# Patient Record
Sex: Male | Born: 1985 | Race: White | Hispanic: No | Marital: Married | Smoking: Former smoker
Health system: Southern US, Community
[De-identification: ages and names within clinical notes are randomized; demographics above are authoritative.]

## PROBLEM LIST (undated history)

## (undated) DIAGNOSIS — N200 Calculus of kidney: Secondary | ICD-10-CM

---

## 2006-04-30 ENCOUNTER — Emergency Department: Payer: Self-pay | Admitting: Emergency Medicine

## 2006-07-11 ENCOUNTER — Emergency Department: Payer: Self-pay | Admitting: Emergency Medicine

## 2007-01-14 ENCOUNTER — Emergency Department: Payer: Self-pay | Admitting: Unknown Physician Specialty

## 2007-12-06 ENCOUNTER — Emergency Department: Payer: Self-pay | Admitting: Emergency Medicine

## 2010-01-08 ENCOUNTER — Emergency Department: Payer: Self-pay | Admitting: Internal Medicine

## 2014-02-10 ENCOUNTER — Emergency Department: Payer: Self-pay | Admitting: Emergency Medicine

## 2014-02-11 LAB — URINALYSIS, COMPLETE
Bacteria: NONE SEEN
Bilirubin,UR: NEGATIVE
GLUCOSE, UR: NEGATIVE mg/dL (ref 0–75)
KETONE: NEGATIVE
Leukocyte Esterase: NEGATIVE
Nitrite: NEGATIVE
PH: 5 (ref 4.5–8.0)
Protein: NEGATIVE
Specific Gravity: 1.027 (ref 1.003–1.030)
Squamous Epithelial: <1
WBC UR: 3 /HPF (ref 0–5)

## 2014-02-11 LAB — CBC
HCT: 45.3 % (ref 40.0–52.0)
HGB: 15.2 g/dL (ref 13.0–18.0)
MCH: 27.9 pg (ref 26.0–34.0)
MCHC: 33.6 g/dL (ref 32.0–36.0)
MCV: 83 fL (ref 80–100)
Platelet: 218 10*3/uL (ref 150–440)
RBC: 5.47 10*6/uL (ref 4.40–5.90)
RDW: 13.2 % (ref 11.5–14.5)
WBC: 13.8 10*3/uL — ABNORMAL HIGH (ref 3.8–10.6)

## 2014-02-11 LAB — COMPREHENSIVE METABOLIC PANEL
ALBUMIN: 3.8 g/dL (ref 3.4–5.0)
ALT: 63 U/L (ref 12–78)
ANION GAP: 8 (ref 7–16)
Alkaline Phosphatase: 78 U/L
BUN: 14 mg/dL (ref 7–18)
Bilirubin,Total: 0.3 mg/dL (ref 0.2–1.0)
CO2: 25 mmol/L (ref 21–32)
Calcium, Total: 9 mg/dL (ref 8.5–10.1)
Chloride: 108 mmol/L — ABNORMAL HIGH (ref 98–107)
Creatinine: 1.07 mg/dL (ref 0.60–1.30)
EGFR (Non-African Amer.): >60
Glucose: 150 mg/dL — ABNORMAL HIGH (ref 65–99)
Osmolality: 285 (ref 275–301)
POTASSIUM: 3.3 mmol/L — AB (ref 3.5–5.1)
SGOT(AST): 39 U/L — ABNORMAL HIGH (ref 15–37)
Sodium: 141 mmol/L (ref 136–145)
TOTAL PROTEIN: 7.4 g/dL (ref 6.4–8.2)

## 2015-04-17 ENCOUNTER — Emergency Department
Admission: EM | Admit: 2015-04-17 | Discharge: 2015-04-17 | Disposition: A | Discharge status: Home / Self Care | Payer: PRIVATE HEALTH INSURANCE | Attending: Emergency Medicine | Admitting: Emergency Medicine

## 2015-04-17 ENCOUNTER — Emergency Department: Payer: PRIVATE HEALTH INSURANCE

## 2015-04-17 ENCOUNTER — Encounter: Payer: Self-pay | Admitting: Emergency Medicine

## 2015-04-17 DIAGNOSIS — J069 Acute upper respiratory infection, unspecified: Secondary | ICD-10-CM | POA: Diagnosis not present

## 2015-04-17 DIAGNOSIS — Z87891 Personal history of nicotine dependence: Secondary | ICD-10-CM | POA: Diagnosis not present

## 2015-04-17 DIAGNOSIS — J9801 Acute bronchospasm: Secondary | ICD-10-CM

## 2015-04-17 DIAGNOSIS — R509 Fever, unspecified: Secondary | ICD-10-CM | POA: Diagnosis present

## 2015-04-17 DIAGNOSIS — Z7951 Long term (current) use of inhaled steroids: Secondary | ICD-10-CM | POA: Diagnosis not present

## 2015-04-17 DIAGNOSIS — J219 Acute bronchiolitis, unspecified: Secondary | ICD-10-CM | POA: Diagnosis not present

## 2015-04-17 MED ORDER — FLUTICASONE PROPIONATE 50 MCG/ACT NA SUSP: 1.0000 | Freq: Every day | NASAL | Status: DC

## 2015-04-17 MED ORDER — BENZONATATE 100 MG PO CAPS: 100.0000 mg | ORAL_CAPSULE | Freq: Three times a day (TID) | ORAL | Status: DC | PRN

## 2015-04-17 MED ORDER — ALBUTEROL SULFATE HFA 108 (90 BASE) MCG/ACT IN AERS: 2.0000 | INHALATION_SPRAY | Freq: Four times a day (QID) | RESPIRATORY_TRACT | Status: AC | PRN

## 2015-04-17 MED ORDER — IPRATROPIUM-ALBUTEROL 0.5-2.5 (3) MG/3ML IN SOLN
RESPIRATORY_TRACT | Status: AC
  Filled 2015-04-17: qty 3

## 2015-04-17 MED ORDER — IPRATROPIUM-ALBUTEROL 0.5-2.5 (3) MG/3ML IN SOLN
3.0000 mL | Freq: Once | RESPIRATORY_TRACT | Status: AC
  Administered 2015-04-17: 3 mL via RESPIRATORY_TRACT

## 2015-04-17 NOTE — Discharge Instructions (Signed)
Bronchospasm °A bronchospasm is a spasm or tightening of the airways going into the lungs. During a bronchospasm breathing becomes more difficult because the airways get smaller. When this happens there can be coughing, a whistling sound when breathing (wheezing), and difficulty breathing. Bronchospasm is often associated with asthma, but not all patients who experience a bronchospasm have asthma. °CAUSES  °A bronchospasm is caused by inflammation or irritation of the airways. The inflammation or irritation may be triggered by:  °· Allergies (such as to animals, pollen, food, or mold). Allergens that cause bronchospasm may cause wheezing immediately after exposure or many hours later.   °· Infection. Viral infections are believed to be the most common cause of bronchospasm.   °· Exercise.   °· Irritants (such as pollution, cigarette smoke, strong odors, aerosol sprays, and paint fumes).   °· Weather changes. Winds increase molds and pollens in the air. Rain refreshes the air by washing irritants out. Cold air may cause inflammation.   °· Stress and emotional upset.   °SIGNS AND SYMPTOMS  °· Wheezing.   °· Excessive nighttime coughing.   °· Frequent or severe coughing with a simple cold.   °· Chest tightness.   °· Shortness of breath.   °DIAGNOSIS  °Bronchospasm is usually diagnosed through a history and physical exam. Tests, such as chest X-rays, are sometimes done to look for other conditions. °TREATMENT  °· Inhaled medicines can be given to open up your airways and help you breathe. The medicines can be given using either an inhaler or a nebulizer machine. °· Corticosteroid medicines may be given for severe bronchospasm, usually when it is associated with asthma. °HOME CARE INSTRUCTIONS  °· Always have a plan prepared for seeking medical care. Know when to call your health care provider and local emergency services (911 in the [place] Know where you can access local emergency care. °· Only take medicines as  directed by your health care provider. °· If you were prescribed an inhaler or nebulizer machine, ask your health care provider to explain how to use it correctly. Always use a spacer with your inhaler if you were given one. °· It is necessary to remain calm during an attack. Try to relax and breathe more slowly.  °· Control your home environment in the following ways:   °¨ Change your heating and air conditioning filter at least once a month.   °¨ Limit your use of fireplaces and wood stoves. °¨ Do not smoke and do not allow smoking in your home.   °¨ Avoid exposure to perfumes and fragrances.   °¨ Get rid of pests (such as roaches and mice) and their droppings.   °¨ Throw away plants if you see mold on them.   °¨ Keep your house clean and dust free.   °¨ Replace carpet with wood, tile, or vinyl flooring. Carpet can trap dander and dust.   °¨ Use allergy-proof pillows, mattress covers, and box spring covers.   °¨ Wash bed sheets and blankets every week in hot water and dry them in a dryer.   °¨ Use blankets that are made of polyester or cotton.   °¨ Wash hands frequently. °SEEK MEDICAL CARE IF:  °· You have muscle aches.   °· You have chest pain.   °· The sputum changes from clear or white to yellow, green, gray, or bloody.   °· The sputum you cough up gets thicker.   °· There are problems that may be related to the medicine you are given, such as a rash, itching, swelling, or trouble breathing.   °SEEK IMMEDIATE MEDICAL CARE IF:  °· You have worsening wheezing and coughing even   after taking your prescribed medicines.   You have increased difficulty breathing.   You develop severe chest pain. MAKE SURE YOU:   Understand these instructions.  Will watch your condition.  Will get help right away if you are not doing well or get worse. Document Released: 11/23/2003 Document Revised: 11/25/2013 Document Reviewed: 05/12/2013 [facility] Patient Information 2015 [place], [place]. This information is not  intended to replace advice given to you by your health care provider. Make sure you discuss any questions you have with your health care provider.  Upper Respiratory Infection, Adult An upper respiratory infection (URI) is also known as the common cold. It is often caused by a type of germ (virus). Colds are easily spread (contagious). You can pass it to others by kissing, coughing, sneezing, or drinking out of the same glass. Usually, you get better in 1 or 2 weeks.  HOME CARE   Only take medicine as told by your doctor.  Use a warm mist humidifier or breathe in steam from a hot shower.  Drink enough water and fluids to keep your pee (urine) clear or pale yellow.  Get plenty of rest.  Return to work when your temperature is back to normal or as told by your doctor. You may use a face mask and wash your hands to stop your cold from spreading. GET HELP RIGHT AWAY IF:   After the first few days, you feel you are getting worse.  You have questions about your medicine.  You have chills, shortness of breath, or brown or red spit (mucus).  You have yellow or brown snot (nasal discharge) or pain in the face, especially when you bend forward.  You have a fever, puffy (swollen) neck, pain when you swallow, or white spots in the back of your throat.  You have a bad headache, ear pain, sinus pain, or chest pain.  You have a high-pitched whistling sound when you breathe in and out (wheezing).  You have a lasting cough or cough up blood.  You have sore muscles or a stiff neck. MAKE SURE YOU:   Understand these instructions.  Will watch your condition.  Will get help right away if you are not doing well or get worse. Document Released: 05/08/2008 Document Revised: 02/12/2012 Document Reviewed: 02/25/2014 [facility] Patient Information 2015 [place], [place]. This information is not intended to replace advice given to you by your health care provider. Make sure you discuss any questions you  have with your health care provider.  Your exam and chest x-ray are essentially normal.  You have a cough that may be allergy-induced.  Take the prescription meds as directed.  [facility] your provider [facility]. Consider starting a daily OTC allergy medicine.

## 2015-04-17 NOTE — ED Provider Notes (Signed)
[facility] Emergency Department Provider Note? ____________________________________________ ? Time seen: 10:42 AM on 04/17/2015 ----------------------------------------- ? I have reviewed the triage vital signs and the nursing notes. ________ HISTORY ? Chief Complaint Cough and Fever  HPI  Darrell Barnes is a 29 y.o. male with nonproductive cough 3 days. He also notes some central chest wall pain and some intermittent chills. He denies any other URI or sinus symptoms, has been dosing daytime cold medicines with minimal relief. He is unclear about whether he has had any low-grade fevers, but does report some intermittent chills as well.  Review of Systems  Constitutional: Negative for fever. Positive for chills Eyes: Negative for visual changes. ENT: Negative for sore throat. Cardiovascular: Negative for chest pain. Respiratory: Negative for shortness of breath. Positive for cough. Gastrointestinal: Negative for abdominal pain, vomiting and diarrhea. Musculoskeletal: Negative for back pain. Skin: Negative for rash. Neurological: Negative for headaches, focal weakness or numbness.  10-point ROS otherwise negative. ____________________________________________  History reviewed. No pertinent past medical history.  There are no active problems to display for this patient. ? History reviewed. No pertinent past surgical history. ? Current Outpatient Rx  Name  Route  Sig  Dispense  Refill  . albuterol (PROVENTIL HFA;VENTOLIN HFA) 108 (90 BASE) MCG/ACT inhaler   Inhalation   Inhale 2 puffs into the lungs every 6 (six) hours as needed for wheezing or shortness of breath.   1 Inhaler   0   . benzonatate (TESSALON PERLES) 100 MG capsule   Oral   Take 1 capsule (100 mg total) by mouth 3 (three) times daily as needed for cough (Take 1-2 per dose).   30 capsule   0   . fluticasone (FLONASE) 50 MCG/ACT nasal spray   Each Nare   Place 1 spray into both  nostrils daily.   16 g   0   ? Allergies Review of patient's allergies indicates no known allergies. ? History reviewed. No pertinent family history. ? Social History History  Substance Use Topics  . Smoking status: Former Games developermoker  . Smokeless tobacco: Never Used  . Alcohol Use: No   PHYSICAL EXAM:  VITAL SIGNS: ED Triage Vitals  Enc Vitals Group     BP 04/17/15 0938 132/57 mmHg     Pulse Rate 04/17/15 0938 83     Resp 04/17/15 0938 20     Temp 04/17/15 0938 99.8 F (37.7 C)     Temp Source 04/17/15 0938 Oral     SpO2 04/17/15 0938 98 %     Weight 04/17/15 0938 260 lb (117.935 kg)     Height 04/17/15 0938 5\' 5"  (1.651 m)     Head Cir --      Peak Flow --      Pain Score 04/17/15 0938 3     Pain Loc --      Pain Edu? --      Excl. in GC? --    Constitutional: Alert and oriented. Well appearing and in no distress. Eyes: Conjunctivae are normal. PERRL. Normal extraocular movements. ENT   Head: Normocephalic and atraumatic.   Nose: No congestion/rhinnorhea.   Mouth/Throat: Mucous membranes are moist.      Ears: Normal external exam. Canals clear. TMs clear bilaterally.   Neck: Supple. No lymphadenopathy. Cardiovascular: Normal rate, regular rhythm. Normal and symmetric distal pulses are present in all extremities. No murmurs, rubs, or gallops. Respiratory: Normal respiratory effort without tachypnea nor retractions. Breath sounds are clear and equal bilaterally.  No wheezes/rales/rhonchi. Gastrointestinal: Soft and nontender.  Musculoskeletal: Normal range of motion in all extremities.  Neurologic:  Normal speech and language. No gross focal neurologic deficits are appreciated.  Skin:  Skin is warm, dry and intact. No rash noted. Psychiatric: Mood and affect are normal. Patient exhibits appropriate insight and judgment.  ___________ RADIOLOGY  CXR IMPRESSION: No acute cardiopulmonary  disease. ______________________________________________________ INITIAL IMPRESSION / ASSESSMENT AND PLAN / ED COURSE ? Bronchitis/URI. Radiology results to patient.  Take meds as directed.  Follow-up with your provider as needed.  Pertinent labs & imaging results that were available during my care of the patient were reviewed by me and considered in my medical decision making (see chart for details). _________________________________________ FINAL CLINICAL IMPRESSION(S) / ED DIAGNOSES?  Final diagnoses:  Bronchospasm, acute  URI, acute      Lissa HoardJenise V Bacon Malvern Kadlec, PA-C 04/17/15 1224  Minna AntisKevin Paduchowski, MD 04/18/15 1209

## 2015-04-17 NOTE — ED Notes (Signed)
Patient reports cough x3 days with mild fever. Unable to give history of exact fever measurements. Patient denies taking any medication today. Reports intermittently coughing up green sputum.

## 2017-08-09 ENCOUNTER — Encounter: Payer: Self-pay | Admitting: Emergency Medicine

## 2017-08-09 ENCOUNTER — Emergency Department
Admission: EM | Admit: 2017-08-09 | Discharge: 2017-08-09 | Disposition: A | Discharge status: Home / Self Care | Payer: PRIVATE HEALTH INSURANCE | Attending: Emergency Medicine | Admitting: Emergency Medicine

## 2017-08-09 ENCOUNTER — Emergency Department: Payer: PRIVATE HEALTH INSURANCE

## 2017-08-09 DIAGNOSIS — Z87891 Personal history of nicotine dependence: Secondary | ICD-10-CM | POA: Diagnosis not present

## 2017-08-09 DIAGNOSIS — K8020 Calculus of gallbladder without cholecystitis without obstruction: Secondary | ICD-10-CM

## 2017-08-09 DIAGNOSIS — N202 Calculus of kidney with calculus of ureter: Secondary | ICD-10-CM | POA: Insufficient documentation

## 2017-08-09 DIAGNOSIS — R109 Unspecified abdominal pain: Secondary | ICD-10-CM | POA: Diagnosis present

## 2017-08-09 DIAGNOSIS — N200 Calculus of kidney: Secondary | ICD-10-CM

## 2017-08-09 HISTORY — DX: Calculus of kidney: N20.0

## 2017-08-09 LAB — URINALYSIS, COMPLETE (UACMP) WITH MICROSCOPIC
Bacteria, UA: NONE SEEN
Bilirubin Urine: NEGATIVE
GLUCOSE, UA: NEGATIVE mg/dL
Ketones, ur: NEGATIVE mg/dL
Leukocytes, UA: NEGATIVE
Nitrite: NEGATIVE
Protein, ur: NEGATIVE mg/dL
Specific Gravity, Urine: 1.02 (ref 1.005–1.030)
pH: 5 (ref 5.0–8.0)

## 2017-08-09 MED ORDER — ONDANSETRON HCL 4 MG/2ML IJ SOLN
INTRAMUSCULAR | Status: AC
  Administered 2017-08-09: 4 mg via INTRAVENOUS
  Filled 2017-08-09: qty 2

## 2017-08-09 MED ORDER — ONDANSETRON HCL 4 MG/2ML IJ SOLN
4.0000 mg | Freq: Once | INTRAMUSCULAR | Status: AC
  Administered 2017-08-09: 4 mg via INTRAVENOUS

## 2017-08-09 MED ORDER — ONDANSETRON 4 MG PO TBDP: 4.0000 mg | ORAL_TABLET | Freq: Three times a day (TID) | ORAL | 0 refills | Status: DC | PRN

## 2017-08-09 MED ORDER — OXYCODONE-ACETAMINOPHEN 5-325 MG PO TABS: 1.0000 | ORAL_TABLET | ORAL | 0 refills | Status: DC | PRN

## 2017-08-09 MED ORDER — MORPHINE SULFATE (PF) 4 MG/ML IV SOLN
INTRAVENOUS | Status: AC
  Administered 2017-08-09: 4 mg via INTRAVENOUS
  Filled 2017-08-09: qty 1

## 2017-08-09 MED ORDER — MORPHINE SULFATE (PF) 4 MG/ML IV SOLN
4.0000 mg | Freq: Once | INTRAVENOUS | Status: AC
  Administered 2017-08-09: 4 mg via INTRAVENOUS

## 2017-08-09 NOTE — ED Provider Notes (Signed)
[facility] Emergency Department Provider Note   First MD Initiated Contact with Patient 08/09/17 [phone]     (approximate)  I have reviewed the triage vital signs and the nursing notes.   HISTORY  Chief Complaint Flank Pain    HPI Darrell Barnes is a 31 y.o. male presents 1 day history of left flank pain and is currently 7 out of 10. Patient states at maximum intensity painis 10 out of 10. Patient admits to one episode of vomiting upon awakening this morning.   Past Medical History:  Diagnosis Date  . Kidney stone     There are no active problems to display for this patient.  Past Surgical History None  Prior to Admission medications   Medication Sig Start Date End Date Taking? Authorizing Provider  albuterol (PROVENTIL HFA;VENTOLIN HFA) 108 (90 BASE) MCG/ACT inhaler Inhale 2 puffs into the lungs every 6 (six) hours as needed for wheezing or shortness of breath. 04/17/15   Menshew, Charlesetta Ivory, PA-C  benzonatate (TESSALON PERLES) 100 MG capsule Take 1 capsule (100 mg total) by mouth 3 (three) times daily as needed for cough (Take 1-2 per dose). 04/17/15   Menshew, Charlesetta Ivory, PA-C  fluticasone (FLONASE) 50 MCG/ACT nasal spray Place 1 spray into both nostrils daily. 04/17/15   Menshew, Charlesetta Ivory, PA-C  ondansetron (ZOFRAN ODT) 4 MG disintegrating tablet Take 1 tablet (4 mg total) by mouth every 8 (eight) hours as needed for nausea or vomiting. 08/09/17   Darci Current, MD  oxyCODONE-acetaminophen (ROXICET) 5-325 MG tablet Take 1 tablet by mouth every 4 (four) hours as needed for severe pain. 08/09/17   Darci Current, MD    Allergies no known drug allergies  No family history on file.  Social History Social History  Substance Use Topics  . Smoking status: Former Games developer  . Smokeless tobacco: Never Used  . Alcohol use No    Review of Systems Constitutional: No fever/chills Eyes: No visual changes. ENT: No sore  throat. Cardiovascular: Denies chest pain. Respiratory: Denies shortness of breath. Gastrointestinal: No abdominal pain.  No nausea, no vomiting.  No diarrhea.  No constipation. Genitourinary: Negative for dysuria. Musculoskeletal: Negative for neck pain.  Positive for left flank pain Integumentary: Negative for rash. Neurological: Negative for headaches, focal weakness or numbness.  ____________________________________________   PHYSICAL EXAM:  VITAL SIGNS: ED Triage Vitals  Enc Vitals Group     BP 08/09/17 0533 (!) 146/97     Pulse Rate 08/09/17 0533 (!) 57     Resp 08/09/17 0533 18     Temp 08/09/17 0533 98 F (36.7 C)     Temp Source 08/09/17 0533 Oral     SpO2 08/09/17 0533 96 %     Weight 08/09/17 0532 113.4 kg (250 lb)     Height 08/09/17 0532 1.626 m ( )     Head Circumference --      Peak Flow --      Pain Score 08/09/17 0531 10     Pain Loc --      Pain Edu? --      Excl. in GC? --     Constitutional: Alert and oriented. apparent discomfort Eyes: Conjunctivae are normal. PERRL. EOMI. Head: Atraumatic. Mouth/Throat: Mucous membranes are moist.  Oropharynx non-erythematous. Neck: No stridor.   Cardiovascular: Normal rate, regular rhythm. Good peripheral circulation. Grossly normal heart sounds. Respiratory: Normal respiratory effort.  No retractions. Lungs CTAB. Gastrointestinal: Soft and nontender.  No distention.  Musculoskeletal: No lower extremity tenderness nor edema. No gross deformities of extremities. Neurologic:  Normal speech and language. No gross focal neurologic deficits are appreciated.  Skin:  Skin is warm, dry and intact. No rash noted. Psychiatric: Mood and affect are normal. Speech and behavior are normal.    RADIOLOGY I, [place] Yaviel Kloster, personally viewed and evaluated these images (plain radiographs) as part of my medical decision making, as well as reviewing the written report by the radiologist.  Ct Renal Stone Study  Result  Date: 08/09/2017 CLINICAL DATA:  Left flank pain radiating to the abdomen since 04/30 in the morning. Nausea and vomiting. History of kidney stones. EXAM: CT ABDOMEN AND PELVIS WITHOUT CONTRAST TECHNIQUE: Multidetector CT imaging of the abdomen and pelvis was performed following the standard protocol without IV contrast. COMPARISON:  02/11/2014 FINDINGS: Lower chest: Atelectasis in the lung bases. Hepatobiliary: Mild diffuse fatty infiltration of the liver. No focal lesions identified. Cholelithiasis with small stone in the gallbladder. No gallbladder wall thickening. No bile duct dilatation. Pancreas: Unremarkable. No pancreatic ductal dilatation or surrounding inflammatory changes. Spleen: Normal in size without focal abnormality. Adrenals/Urinary Tract: No adrenal gland nodules. 5 mm stone in the proximal left ureter at the ureteropelvic junction with mild proximal hydronephrosis and stranding around the left ureter. Distal left ureter is decompressed. Punctate size nonobstructing stone in the right kidney. No right ureteral stone or dilatation. No bladder wall thickening or filling defect. Stomach/Bowel: Stomach is within normal limits. Appendix appears normal. No evidence of bowel wall thickening, distention, or inflammatory changes. Vascular/Lymphatic: No significant vascular findings are present. No enlarged abdominal or pelvic lymph nodes. Reproductive: Prostate is unremarkable. Other: No abdominal wall hernia or abnormality. No abdominopelvic ascites. Musculoskeletal: No acute or significant osseous findings. IMPRESSION: 5 mm stone in the proximal left ureter with moderate proximal obstruction. Tiny nonobstructing stone in the right kidney. Mild diffuse fatty infiltration of the liver. Cholelithiasis without inflammatory changes of the gallbladder. Electronically Signed   By: Burman NievesWilliam  Stevens M.D.   On: 08/09/2017 06:29      Procedures   ____________________________________________   INITIAL  IMPRESSION / ASSESSMENT AND PLAN / ED COURSE  Pertinent labs & imaging results that were available during my care of the patient were reviewed by me and considered in my medical decision making (see chart for details).  patient given IV morphine and Zofran with improvement in pain and resolution of nausea. Patient history physical exam concerning for possible ureterolithiasis which was confirmed with CT scan. Patient has a 5 mm proximal stone. I spoke with patient at length regarding warning signs are warm and transient emergency department. Patient also informed of the Simental finding of gallstones.      ____________________________________________  FINAL CLINICAL IMPRESSION(S) / ED DIAGNOSES  Final diagnoses:  Kidney stone on left side  Calculus of gallbladder without cholecystitis without obstruction     MEDICATIONS GIVEN DURING THIS VISIT:  Medications  ondansetron (ZOFRAN) injection 4 mg (4 mg Intravenous Given 08/09/17 0607)  morphine 4 MG/ML injection 4 mg (4 mg Intravenous Given 08/09/17 0608)     NEW OUTPATIENT MEDICATIONS STARTED DURING THIS VISIT:  New Prescriptions   ONDANSETRON (ZOFRAN ODT) 4 MG DISINTEGRATING TABLET    Take 1 tablet (4 mg total) by mouth every 8 (eight) hours as needed for nausea or vomiting.   OXYCODONE-ACETAMINOPHEN (ROXICET) 5-325 MG TABLET    Take 1 tablet by mouth every 4 (four) hours as needed for severe pain.  Modified Medications   No medications on file    Discontinued Medications   No medications on file     Note:  This document was prepared using Dragon voice recognition software and may include unintentional dictation errors.    Darci Current, MD 08/09/17 [phone]

## 2017-08-09 NOTE — ED Notes (Signed)
Patient transported to CT 

## 2017-08-09 NOTE — ED Triage Notes (Signed)
Patient ambulatory to triage with steady gait, without difficulty or distress noted; pt reports left flank pain radiating into abd since awakening at 430am accomp by N/V; st hx kidney stones

## 2017-08-09 NOTE — ED Notes (Signed)

## 2017-09-13 ENCOUNTER — Ambulatory Visit: Payer: Self-pay | Admitting: Urology

## 2017-09-22 ENCOUNTER — Emergency Department: Payer: PRIVATE HEALTH INSURANCE

## 2017-09-22 ENCOUNTER — Emergency Department
Admission: EM | Admit: 2017-09-22 | Discharge: 2017-09-23 | Disposition: A | Discharge status: Home / Self Care | Payer: PRIVATE HEALTH INSURANCE | Attending: Emergency Medicine | Admitting: Emergency Medicine

## 2017-09-22 DIAGNOSIS — N200 Calculus of kidney: Secondary | ICD-10-CM | POA: Diagnosis not present

## 2017-09-22 DIAGNOSIS — Z87891 Personal history of nicotine dependence: Secondary | ICD-10-CM | POA: Insufficient documentation

## 2017-09-22 DIAGNOSIS — R300 Dysuria: Secondary | ICD-10-CM | POA: Diagnosis present

## 2017-09-22 DIAGNOSIS — Z79899 Other long term (current) drug therapy: Secondary | ICD-10-CM | POA: Insufficient documentation

## 2017-09-22 DIAGNOSIS — R319 Hematuria, unspecified: Secondary | ICD-10-CM | POA: Insufficient documentation

## 2017-09-22 DIAGNOSIS — R8271 Bacteriuria: Secondary | ICD-10-CM | POA: Insufficient documentation

## 2017-09-22 LAB — HEPATIC FUNCTION PANEL
ALBUMIN: 3.9 g/dL (ref 3.5–5.0)
ALT: 29 U/L (ref 17–63)
AST: 23 U/L (ref 15–41)
Alkaline Phosphatase: 56 U/L (ref 38–126)
BILIRUBIN TOTAL: 0.6 mg/dL (ref 0.3–1.2)
Bilirubin, Direct: <0.1 mg/dL — ABNORMAL LOW (ref 0.1–0.5)
TOTAL PROTEIN: 7 g/dL (ref 6.5–8.1)

## 2017-09-22 LAB — BASIC METABOLIC PANEL
ANION GAP: 7 (ref 5–15)
BUN: 18 mg/dL (ref 6–20)
CO2: 26 mmol/L (ref 22–32)
Calcium: 9.2 mg/dL (ref 8.9–10.3)
Chloride: 104 mmol/L (ref 101–111)
Creatinine, Ser: 0.96 mg/dL (ref 0.61–1.24)
GFR calc Af Amer: >60 mL/min (ref >60)
Glucose, Bld: 113 mg/dL — ABNORMAL HIGH (ref 65–99)
POTASSIUM: 3.9 mmol/L (ref 3.5–5.1)
SODIUM: 137 mmol/L (ref 135–145)

## 2017-09-22 LAB — URINALYSIS, COMPLETE (UACMP) WITH MICROSCOPIC
Bilirubin Urine: NEGATIVE
Glucose, UA: NEGATIVE mg/dL
Ketones, ur: NEGATIVE mg/dL
LEUKOCYTES UA: NEGATIVE
NITRITE: NEGATIVE
PROTEIN: 100 mg/dL — AB
Specific Gravity, Urine: 1.019 (ref 1.005–1.030)
pH: 6 (ref 5.0–8.0)

## 2017-09-22 LAB — CBC
HEMATOCRIT: 42.9 % (ref 40.0–52.0)
Hemoglobin: 15.1 g/dL (ref 13.0–18.0)
MCH: 28.4 pg (ref 26.0–34.0)
MCHC: 35.1 g/dL (ref 32.0–36.0)
MCV: 81.1 fL (ref 80.0–100.0)
Platelets: 183 10*3/uL (ref 150–440)
RBC: 5.3 MIL/uL (ref 4.40–5.90)
RDW: 13.3 % (ref 11.5–14.5)
WBC: 9.9 10*3/uL (ref 3.8–10.6)

## 2017-09-22 MED ORDER — AMOXICILLIN 500 MG PO CAPS
500.0000 mg | ORAL_CAPSULE | Freq: Once | ORAL | Status: AC
  Administered 2017-09-23: 500 mg via ORAL
  Filled 2017-09-22: qty 1

## 2017-09-22 MED ORDER — ONDANSETRON 4 MG PO TBDP: 4.0000 mg | ORAL_TABLET | Freq: Four times a day (QID) | ORAL | 0 refills | Status: DC | PRN

## 2017-09-22 MED ORDER — IBUPROFEN 800 MG PO TABS
800.0000 mg | ORAL_TABLET | ORAL | Status: AC
  Administered 2017-09-22: 800 mg via ORAL
  Filled 2017-09-22: qty 1

## 2017-09-22 MED ORDER — AMOXICILLIN 500 MG PO CAPS: 500.0000 mg | ORAL_CAPSULE | Freq: Two times a day (BID) | ORAL | 0 refills | Status: DC

## 2017-09-22 MED ORDER — TAMSULOSIN HCL 0.4 MG PO CAPS: 0.4000 mg | ORAL_CAPSULE | Freq: Every day | ORAL | 0 refills | Status: DC

## 2017-09-22 NOTE — ED Provider Notes (Addendum)
[facility] Emergency Department Provider Note   ____________________________________________   First MD Initiated Contact with Patient 09/22/17 2234     (approximate)  I have reviewed the triage vital signs and the nursing notes.   HISTORY  Chief Complaint Dysuria    HPI Darrell Barnes is a 31 y.o. male here for evaluation of discomfort with urination  Patient reports he has a history of kidney stones, for the last day he has been experiencing discomfort when he urinates and has urge to urinate.  He has had some slight discomfort in his lower back, but reports no pain at present.  Reports pain is primarily when he goes to urinate, and is having an urge and a feeling to urinate.  No fevers chills nausea or vomiting.  He reports he is not having significant pain like he did with previous kidney stones, where he had significant flank pain.  No testicular or scrotal pain.  Denies trouble urinating, but rather reports it hurts to urinate.  No shortness of breath or chest pain.  He has a urology follow-up set up for the end of this month  Past Medical History:  Diagnosis Date  . Kidney stone     There are no active problems to display for this patient.   No past surgical history on file.  Prior to Admission medications   Medication Sig Start Date End Date Taking? Authorizing Provider  albuterol (PROVENTIL HFA;VENTOLIN HFA) 108 (90 BASE) MCG/ACT inhaler Inhale 2 puffs into the lungs every 6 (six) hours as needed for wheezing or shortness of breath. 04/17/15   Menshew, Charlesetta Ivory, PA-C  amoxicillin (AMOXIL) 500 MG capsule Take 1 capsule (500 mg total) by mouth 2 (two) times daily. 09/22/17   Sharyn Creamer, MD  benzonatate (TESSALON PERLES) 100 MG capsule Take 1 capsule (100 mg total) by mouth 3 (three) times daily as needed for cough (Take 1-2 per dose). 04/17/15   Menshew, Charlesetta Ivory, PA-C  fluticasone (FLONASE) 50 MCG/ACT nasal spray Place 1 spray  into both nostrils daily. 04/17/15   Menshew, Charlesetta Ivory, PA-C  ondansetron (ZOFRAN ODT) 4 MG disintegrating tablet Take 1 tablet (4 mg total) by mouth every 6 (six) hours as needed for nausea or vomiting. 09/22/17   Sharyn Creamer, MD  oxyCODONE-acetaminophen (ROXICET) 5-325 MG tablet Take 1 tablet by mouth every 4 (four) hours as needed for severe pain. 08/09/17   Darci Current, MD  tamsulosin (FLOMAX) 0.4 MG CAPS capsule Take 1 capsule (0.4 mg total) by mouth daily. 09/22/17   Sharyn Creamer, MD    Allergies Patient has no known allergies.  No family history on file.  Social History Social History  Substance Use Topics  . Smoking status: Former Games developer  . Smokeless tobacco: Never Used  . Alcohol use No    Review of Systems Constitutional: No fever/chills Eyes: No visual changes. ENT: No sore throat. Cardiovascular: Denies chest pain. Respiratory: Denies shortness of breath. Gastrointestinal: No abdominal pain.  No flank pain no nausea, no vomiting.  No diarrhea.  No constipation. Genitourinary: See HPI.  No urethral discharge Musculoskeletal: Negative for back pain. Skin: Negative for rash. Neurological: Negative for headaches, focal weakness or numbness.    ____________________________________________   PHYSICAL EXAM:  VITAL SIGNS: ED Triage Vitals  Enc Vitals Group     BP 09/22/17 2146 124/71     Pulse Rate 09/22/17 2146 62     Resp 09/22/17 2146 18  Temp 09/22/17 2146 98.3 F (36.8 C)     Temp Source 09/22/17 2146 Oral     SpO2 09/22/17 2146 97 %     Weight 09/22/17 2143 260 lb (117.9 kg)     Height 09/22/17 2143 5\' 4"  (1.626 m)     Head Circumference --      Peak Flow --      Pain Score 09/22/17 2143 3     Pain Loc --      Pain Edu? --      Excl. in GC? --     Constitutional: Alert and oriented. Well appearing and in no acute distress. Eyes: Conjunctivae are normal. Head: Atraumatic. Nose: No congestion/rhinnorhea. Mouth/Throat: Mucous membranes  are moist. Neck: No stridor.   Cardiovascular: Normal rate, regular rhythm. Grossly normal heart sounds.  Good peripheral circulation. Respiratory: Normal respiratory effort.  No retractions. Lungs CTAB. Gastrointestinal: Soft and nontender there reports that he feels slightly uncomfortable to palpate in the suprapubic region without mass or distention noted. No distention. The testicles and scrotum are nontender.  No erythema over the scrotum or penis.  No penile discharge Musculoskeletal: No lower extremity tenderness nor edema. Neurologic:  Normal speech and language. No gross focal neurologic deficits are appreciated.  Skin:  Skin is warm, dry and intact. No rash noted. Psychiatric: Mood and affect are normal. Speech and behavior are normal.  ____________________________________________   LABS (all labs ordered are listed, but only abnormal results are displayed)  Labs Reviewed  URINALYSIS, COMPLETE (UACMP) WITH MICROSCOPIC - Abnormal; Notable for the following:       Result Value   Color, Urine YELLOW (*)    APPearance CLEAR (*)    Hgb urine dipstick MODERATE (*)    Protein, ur 100 (*)    Bacteria, UA RARE (*)    Squamous Epithelial / LPF 0-5 (*)    All other components within normal limits  BASIC METABOLIC PANEL - Abnormal; Notable for the following:    Glucose, Bld 113 (*)    All other components within normal limits  HEPATIC FUNCTION PANEL - Abnormal; Notable for the following:    Bilirubin, Direct <0.1 (*)    All other components within normal limits  URINE CULTURE  CBC   ____________________________________________  EKG   ____________________________________________  RADIOLOGY  [place] Renal  Result Date: 09/23/2017 CLINICAL DATA:  Gross hematuria.  Previous history of stones. EXAM: RENAL / URINARY TRACT ULTRASOUND COMPLETE COMPARISON:  CT abdomen and pelvis 08/09/2017 FINDINGS: Right Kidney: Length: 11.8 cm. Echogenicity within normal limits. No mass or  hydronephrosis visualized. Left Kidney: Length: 11.6 cm. Minimal hydronephrosis is demonstrated. No focal lesions are appreciated. Parenchymal thickness and echotexture is normal. Bladder: There is an echogenic focus in the distal left ureter at the ureterovesical junction consistent with a stone. This is measured at 1.2 cm but this ultrasound measurement may be overestimating the size of the stone. Bladder wall is not thickened and no intraluminal filling defects are demonstrated. The left urine flow jet is demonstrated on color flow Doppler imaging. IMPRESSION: Stone demonstrated in the distal left ureter at the ureterovesical junction with mild proximal hydronephrosis in the left kidney. Electronically Signed   By: Burman NievesWilliam  Stevens M.D.   On: 09/23/2017 00:01    ____________________________________________   PROCEDURES  Procedure(s) performed: None  Procedures  Critical Care performed: No  ____________________________________________   INITIAL IMPRESSION / ASSESSMENT AND PLAN / ED COURSE  Pertinent labs & imaging results that were available during my  care of the patient were reviewed by me and considered in my medical decision making (see chart for details).  Patient with a history of previous kidney stones, presents today with urgency urinary.  Hematuria is notable on his urinalysis and I suspect he may be passing for having symptoms of another kidney stone.  He reports he passed his previous stones, and has made an appointment with urology.  He does have a few bacteria notable in the urine, but no signs or symptoms of systemic infection.  I suspect this represents likely kidney stone related discomfort given the blood and hematuria.  I will place him on amoxicillin out of precaution given bacteriuria and send a culture.  No signs or symptoms of an acute abdomen.  No signs or symptoms to suggest appendicitis.  He has no pain at McBurney's point, has a negative Rovsing.  Do not think a  repeat CT is necessary unless significant hydronephrosis is notable on ultrasound.  ----------------------------------------- 11:31 PM on 09/22/2017 -----------------------------------------  Ongoing care assigned to Dr. Roxan Hockey.  Follow-up on ultrasound renal, if no hydronephrosis but reevaluate with patient and recommend likely discharged home with amoxicillin, Flomax, Zofran and follow-up with urology sooner if possible.  Suspect hematuria likely secondary to kidney stone or less likely urinary tract infection.     ----------------------------------------- 12:19 AM on 09/23/2017 -----------------------------------------  Ultrasound reviewed, demonstrates stone in the left UVJ.  I am suspicious this may actually be the same stone he is, and as mentioned in the report the ultrasound may be over estimating the size.  The patient is nontoxic, well-appearing.  His pain is well controlled after ibuprofen.  He appears appropriate for outpatient follow-up, and careful return precautions were advised.  At this point my diagnosis is likely kidney stone causing his hematuria and associated urgency, felt far less likely to represent UTI but I will place him on amoxicillin as well in the event there could be early infection though at this juncture I doubt it clinically.  Return precautions and treatment recommendations and follow-up discussed with the patient who is agreeable with the plan.  ____________________________________________   FINAL CLINICAL IMPRESSION(S) / ED DIAGNOSES  Final diagnoses:  Bacteria in urine  Hematuria, unspecified type      NEW MEDICATIONS STARTED DURING THIS VISIT:  New Prescriptions   AMOXICILLIN (AMOXIL) 500 MG CAPSULE    Take 1 capsule (500 mg total) by mouth 2 (two) times daily.   ONDANSETRON (ZOFRAN ODT) 4 MG DISINTEGRATING TABLET    Take 1 tablet (4 mg total) by mouth every 6 (six) hours as needed for nausea or vomiting.   TAMSULOSIN (FLOMAX) 0.4 MG CAPS  CAPSULE    Take 1 capsule (0.4 mg total) by mouth daily.     Note:  This document was prepared using Dragon voice recognition software and may include unintentional dictation errors.     Sharyn Creamer, MD 09/22/17 9562    Sharyn Creamer, MD 09/23/17 0020

## 2017-09-22 NOTE — ED Triage Notes (Signed)
Patient reports history of kidney stones.  Patient reports today with dysuria and lower back pain.

## 2017-09-22 NOTE — ED Notes (Signed)
To ultrasound

## 2017-09-22 NOTE — ED Notes (Addendum)
Pt brought back to room 16 labs and urine resulted from triage. Pt here with dysuria with history of kidney stones. Dx with one on 9/6 - did not follow up, states it passed. States he had symptoms of another one 2 weeks ago so took the left over percocet and phenergan - was unable to keep urology appt and now has an appt at the end of the month.

## 2017-09-22 NOTE — ED Notes (Signed)
Returned to room.

## 2017-09-22 NOTE — Discharge Instructions (Addendum)
You have been seen in the Emergency Department (ED) today for pain and discomoft when urinating.  Your workup today suggests that you may have another kidney stone as there is blood in your urine, but we also wish to treat you with an antibiotic as a few bacteria are also noted (which can be a sign of a urinary tract infection.)  Please take your antibiotic as prescribed and over-the-counter pain medication (Tylenol or Motrin) as needed, but no more than recommended on the label instructions.  Drink PLENTY of fluids.  Follow-up with Urology as you already planned at the end of this month or at a sooner, next available appointment to follow up on today?s ED visit, or return immediately to the ED if your pain worsens, you have decreased urine production, develop fever, persistent vomiting, or other symptoms that concern you.

## 2017-09-24 LAB — URINE CULTURE
CULTURE: NO GROWTH
SPECIAL REQUESTS: NORMAL

## 2017-10-03 ENCOUNTER — Ambulatory Visit (INDEPENDENT_AMBULATORY_CARE_PROVIDER_SITE_OTHER): Payer: PRIVATE HEALTH INSURANCE | Admitting: Urology

## 2017-10-03 ENCOUNTER — Encounter: Payer: Self-pay | Admitting: Urology

## 2017-10-03 VITALS — BP 107/72 | HR 73 | Ht 64.0 in | Wt 278.9 lb

## 2017-10-03 DIAGNOSIS — N200 Calculus of kidney: Secondary | ICD-10-CM | POA: Diagnosis not present

## 2017-10-03 LAB — URINALYSIS, COMPLETE
BILIRUBIN UA: NEGATIVE
GLUCOSE, UA: NEGATIVE
KETONES UA: NEGATIVE
Leukocytes, UA: NEGATIVE
Nitrite, UA: NEGATIVE
PH UA: 7 (ref 5.0–7.5)
PROTEIN UA: NEGATIVE
RBC UA: NEGATIVE
SPEC GRAV UA: 1.02 (ref 1.005–1.030)
Urobilinogen, Ur: 1 mg/dL (ref 0.2–1.0)

## 2017-10-04 DIAGNOSIS — N200 Calculus of kidney: Secondary | ICD-10-CM | POA: Insufficient documentation

## 2017-10-04 NOTE — Progress Notes (Signed)
10/03/2017 7:21 AM   Darrell Barnes 12/17/1985 [id number]  Referring provider: No referring provider defined for this encounter.  Chief Complaint  Patient presents with  . Nephrolithiasis    HPI: Darrell Barnes is a 31 y.o. male who presents today for evaluation of nephrolithiasis.  He presented to [facility] ED on 08/09/2017 with a 1 day history of left flank pain.  There for no identifiable precipitating, aggravating or alleviating factors.  Severity was rated 10/10.  He had associated nausea and vomiting.  Noncontrast CT of the abdomen and pelvis was performed which was remarkable for a 5 mm left proximal ureteral stone with moderate hydronephrosis/hydroureter.  He was discharged on oral analgesics and Zofran.  His pain improved but he returned to the ED on 09/22/2017 complaining of urinary frequency, urgency and sensation of incomplete emptying.  A renal ultrasound was performed which showed the calculus at the UVJ.  He subsequently passed the stone on 09/24/2017 and has been asymptomatic since that time.  He had one prior stone episode in 2015 and had a 2 mm UVJ calculus on CT.  His CT performed in September did show a small, nonobstructing right renal calculus.  He was not able to retrieve his stone.  He denies history of chronic bowel disease.   PMH: Past Medical History:  Diagnosis Date  . Kidney stone     Surgical History: History reviewed. No pertinent surgical history.  Home Medications:  Allergies as of 10/03/2017   No Known Allergies     Medication List       Accurate as of 10/03/17 11:59 PM. Always use your most recent med list.          albuterol 108 (90 Base) MCG/ACT inhaler Commonly known as:  PROVENTIL HFA;VENTOLIN HFA Inhale 2 puffs into the lungs every 6 (six) hours as needed for wheezing or shortness of breath.       Allergies: No Known Allergies  Family History: History reviewed. No pertinent family history.  Social History:  reports  that he has quit smoking. He has never used smokeless tobacco. He reports that he does not drink alcohol or use drugs.  ROS: Pertinent negatives: Denies nausea, vomiting, abdominal pain, fever, chills.  The remainder of the review of systems was negative.  Physical Exam: BP 107/72 (BP Location: Right Arm, Patient Position: Sitting, Cuff Size: Large)   Pulse 73   Ht 5\' 4"  (1.626 m)   Wt 278 lb 14.4 oz (126.5 kg)   BMI 47.87 kg/m   Constitutional:  Alert and oriented, No acute distress. HEENT: [place] AT, moist mucus membranes.  Trachea midline, no masses. Cardiovascular: No clubbing, cyanosis, or edema. Respiratory: Normal respiratory effort, no increased work of breathing. GI: Abdomen is soft, nontender, nondistended, no abdominal masses GU: No CVA tenderness.  Skin: No rashes, bruises or suspicious lesions. Lymph: No cervical or inguinal adenopathy. Neurologic: Grossly intact, no focal deficits, moving all 4 extremities. Psychiatric: Normal mood and affect.  Laboratory Data: Lab Results  Component Value Date   WBC 9.9 09/22/2017   HGB 15.1 09/22/2017   HCT 42.9 09/22/2017   MCV 81.1 09/22/2017   PLT 183 09/22/2017    Lab Results  Component Value Date   CREATININE 0.96 09/22/2017   Urinalysis Lab Results  Component Value Date   SPECGRAV 1.020 10/03/2017   PHUR 7.0 10/03/2017   COLORU Yellow 10/03/2017   APPEARANCEUR Clear 10/03/2017   LEUKOCYTESUR Negative 10/03/2017   PROTEINUR Negative 10/03/2017  GLUCOSEU Negative 10/03/2017   KETONESU Negative 10/03/2017   RBCU Negative 10/03/2017   BILIRUBINUR Negative 10/03/2017   UUROB 1.0 10/03/2017   NITRITE Negative 10/03/2017    Lab Results  Component Value Date   BACTERIA RARE (A) 09/22/2017    Pertinent Imaging:  Results for orders placed during the hospital encounter of 09/22/17  US RENAL   Narrative CLINICAL DATA:  Gross hematuria.  Previous history of stones.  EXAM: RENAL / URINARY TRACT ULTRASOUND  COMPLETE  COMPARISON:  CT abdomen and pelvis 08/09/2017  FINDINGS: Right Kidney:  Length: 11.8 cm. Echogenicity within normal limits. No mass or hydronephrosis visualized.  Left Kidney:  Length: 11.6 cm. Minimal hydronephrosis is demonstrated. No focal lesions are appreciated. Parenchymal thickness and echotexture is normal.  Bladder:  There is an echogenic focus in the distal left ureter at the ureterovesical junction consistent with a stone. This is measured at 1.2 cm but this ultrasound measurement may be overestimating the size of the stone. Bladder wall is not thickened and no intraluminal filling defects are demonstrated. The left urine flow jet is demonstrated on color flow Doppler imaging.  IMPRESSION: Stone demonstrated in the distal left ureter at the ureterovesical junction with mild proximal hydronephrosis in the left kidney.   Electronically Signed   By: Burman Nieves M.D.   On: 09/23/2017 00:01     Results for orders placed during the hospital encounter of 08/09/17  CT Renal Stone Study   Narrative CLINICAL DATA:  Left flank pain radiating to the abdomen since 04/30 in the morning. Nausea and vomiting. History of kidney stones.  EXAM: CT ABDOMEN AND PELVIS WITHOUT CONTRAST  TECHNIQUE: Multidetector CT imaging of the abdomen and pelvis was performed following the standard protocol without IV contrast.  COMPARISON:  02/11/2014  FINDINGS: Lower chest: Atelectasis in the lung bases.  Hepatobiliary: Mild diffuse fatty infiltration of the liver. No focal lesions identified. Cholelithiasis with small stone in the gallbladder. No gallbladder wall thickening. No bile duct dilatation.  Pancreas: Unremarkable. No pancreatic ductal dilatation or surrounding inflammatory changes.  Spleen: Normal in size without focal abnormality.  Adrenals/Urinary Tract: No adrenal gland nodules. 5 mm stone in the proximal left ureter at the ureteropelvic junction  with mild proximal hydronephrosis and stranding around the left ureter. Distal left ureter is decompressed. Punctate size nonobstructing stone in the right kidney. No right ureteral stone or dilatation. No bladder wall thickening or filling defect.  Stomach/Bowel: Stomach is within normal limits. Appendix appears normal. No evidence of bowel wall thickening, distention, or inflammatory changes.  Vascular/Lymphatic: No significant vascular findings are present. No enlarged abdominal or pelvic lymph nodes.  Reproductive: Prostate is unremarkable.  Other: No abdominal wall hernia or abnormality. No abdominopelvic ascites.  Musculoskeletal: No acute or significant osseous findings.  IMPRESSION: 5 mm stone in the proximal left ureter with moderate proximal obstruction. Tiny nonobstructing stone in the right kidney. Mild diffuse fatty infiltration of the liver. Cholelithiasis without inflammatory changes of the gallbladder.   Electronically Signed   By: Burman Nieves M.D.   On: 08/09/2017 06:29     Assessment & Plan:    1. Nephrolithiasis Recurrent stone disease with a recently passed calculus.  He has a small nonobstructing right renal calculus.  Blood work in the ED including a serum calcium level was normal.  Have recommended proceeding with a metabolic evaluation and an order for a 24 urine study will be faxed to [facility].  He will follow-up in approximately 6 weeks.  -  Urinalysis, Complete   Return in about 6 weeks (around 11/14/2017) for Recheck.  Riki Altes, MD  [facility] [address] [place], [place] [address] [phone]

## 2017-11-14 ENCOUNTER — Encounter: Payer: Self-pay | Admitting: Urology

## 2017-11-14 ENCOUNTER — Ambulatory Visit: Payer: PRIVATE HEALTH INSURANCE | Admitting: Urology

## 2018-07-24 ENCOUNTER — Other Ambulatory Visit: Payer: Self-pay

## 2018-07-24 ENCOUNTER — Emergency Department
Admission: EM | Admit: 2018-07-24 | Discharge: 2018-07-25 | Disposition: A | Discharge status: Home / Self Care | Payer: PRIVATE HEALTH INSURANCE | Attending: Student in an Organized Health Care Education/Training Program | Admitting: Student in an Organized Health Care Education/Training Program

## 2018-07-24 ENCOUNTER — Encounter: Payer: Self-pay | Admitting: Emergency Medicine

## 2018-07-24 ENCOUNTER — Emergency Department: Payer: PRIVATE HEALTH INSURANCE

## 2018-07-24 DIAGNOSIS — R1084 Generalized abdominal pain: Secondary | ICD-10-CM | POA: Insufficient documentation

## 2018-07-24 DIAGNOSIS — Z87891 Personal history of nicotine dependence: Secondary | ICD-10-CM | POA: Insufficient documentation

## 2018-07-24 DIAGNOSIS — R109 Unspecified abdominal pain: Secondary | ICD-10-CM

## 2018-07-24 DIAGNOSIS — N200 Calculus of kidney: Secondary | ICD-10-CM

## 2018-07-24 LAB — CBC WITH DIFFERENTIAL/PLATELET
BASOS ABS: 0 10*3/uL (ref 0–0.1)
BASOS PCT: 0 %
EOS PCT: 0 %
Eosinophils Absolute: 0 10*3/uL (ref 0–0.7)
HEMATOCRIT: 43.5 % (ref 40.0–52.0)
Hemoglobin: 15.2 g/dL (ref 13.0–18.0)
Lymphocytes Relative: 14 %
Lymphs Abs: 2.3 10*3/uL (ref 1.0–3.6)
MCH: 28.9 pg (ref 26.0–34.0)
MCHC: 35.1 g/dL (ref 32.0–36.0)
MCV: 82.4 fL (ref 80.0–100.0)
MONO ABS: 0.9 10*3/uL (ref 0.2–1.0)
Monocytes Relative: 5 %
NEUTROS ABS: 13.4 10*3/uL — AB (ref 1.4–6.5)
Neutrophils Relative %: 81 %
PLATELETS: 193 10*3/uL (ref 150–440)
RBC: 5.27 MIL/uL (ref 4.40–5.90)
RDW: 13.6 % (ref 11.5–14.5)
WBC: 16.6 10*3/uL — ABNORMAL HIGH (ref 3.8–10.6)

## 2018-07-24 LAB — URINALYSIS, COMPLETE (UACMP) WITH MICROSCOPIC
Bilirubin Urine: NEGATIVE
GLUCOSE, UA: NEGATIVE mg/dL
KETONES UR: NEGATIVE mg/dL
Leukocytes, UA: NEGATIVE
Nitrite: NEGATIVE
PROTEIN: NEGATIVE mg/dL
Specific Gravity, Urine: 1.023 (ref 1.005–1.030)
Squamous Epithelial / LPF: NONE SEEN (ref 0–5)
pH: 5 (ref 5.0–8.0)

## 2018-07-24 LAB — BASIC METABOLIC PANEL
ANION GAP: 10 (ref 5–15)
BUN: 16 mg/dL (ref 6–20)
CALCIUM: 8.9 mg/dL (ref 8.9–10.3)
CO2: 24 mmol/L (ref 22–32)
Chloride: 107 mmol/L (ref 98–111)
Creatinine, Ser: 0.88 mg/dL (ref 0.61–1.24)
Glucose, Bld: 139 mg/dL — ABNORMAL HIGH (ref 70–99)
Potassium: 3.7 mmol/L (ref 3.5–5.1)
Sodium: 141 mmol/L (ref 135–145)

## 2018-07-24 MED ORDER — HYDROCODONE-ACETAMINOPHEN 5-325 MG PO TABS
1.0000 | ORAL_TABLET | Freq: Once | ORAL | Status: AC
  Administered 2018-07-24: 1 via ORAL
  Filled 2018-07-24: qty 1

## 2018-07-24 MED ORDER — IOPAMIDOL (ISOVUE-300) INJECTION 61%
100.0000 mL | Freq: Once | INTRAVENOUS | Status: AC | PRN
  Administered 2018-07-25: 100 mL via INTRAVENOUS

## 2018-07-24 MED ORDER — KETOROLAC TROMETHAMINE 30 MG/ML IJ SOLN
15.0000 mg | Freq: Once | INTRAMUSCULAR | Status: AC
  Administered 2018-07-24: 15 mg via INTRAVENOUS
  Filled 2018-07-24: qty 1

## 2018-07-24 MED ORDER — HYDROCODONE-ACETAMINOPHEN 5-325 MG PO TABS: 1.0000 | ORAL_TABLET | Freq: Four times a day (QID) | ORAL | 0 refills | Status: DC | PRN

## 2018-07-24 MED ORDER — ONDANSETRON HCL 4 MG PO TABS: 4.0000 mg | ORAL_TABLET | Freq: Every day | ORAL | 0 refills | Status: AC | PRN

## 2018-07-24 NOTE — ED Triage Notes (Signed)
Pt c/o right flank pain with N/V since this evening. Pt has hx/o kidney stones and reports symptoms the same. Pt denies urinary syptoms at this time.

## 2018-07-24 NOTE — Discharge Instructions (Signed)

## 2018-07-24 NOTE — ED Notes (Signed)
Pt went to medical imaging.   

## 2018-07-24 NOTE — ED Provider Notes (Signed)
[facility] Emergency Department Provider Note    First MD Initiated Contact with Patient 07/24/18 2135     (approximate)  I have reviewed the triage vital signs and the nursing notes.   HISTORY  Chief Complaint Flank Pain    HPI Darrell Barnes is a 32 y.o. male history of kidney stones presents the ER with right flank pain associated with nausea and vomiting since this evening.  States it feels similar to previous episodes of kidney stone.  Is not had an episode in over a year.  Denies any fevers.  No abdominal pain.  Does have pain radiating to his right testicle.  Denies any dysuria but has noted discolored urine.  Says the pain is mild to moderate in severity.    Past Medical History:  Diagnosis Date  . Kidney stone    History reviewed. No pertinent family history. History reviewed. No pertinent surgical history. Patient Active Problem List   Diagnosis Date Noted  . Nephrolithiasis 10/04/2017      Prior to Admission medications   Medication Sig Start Date End Date Taking? Authorizing Provider  albuterol (PROVENTIL HFA;VENTOLIN HFA) 108 (90 BASE) MCG/ACT inhaler Inhale 2 puffs into the lungs every 6 (six) hours as needed for wheezing or shortness of breath. Patient not taking: Reported on 10/03/2017 04/17/15   Menshew, Charlesetta IvoryJenise V Bacon, PA-C    Allergies Patient has no known allergies.    Social History Social History   Tobacco Use  . Smoking status: Former Games developermoker  . Smokeless tobacco: Never Used  Substance Use Topics  . Alcohol use: No  . Drug use: No    Review of Systems Patient denies headaches, rhinorrhea, blurry vision, numbness, shortness of breath, chest pain, edema, cough, abdominal pain, nausea, vomiting, diarrhea, dysuria, fevers, rashes or hallucinations unless otherwise stated above in HPI. ____________________________________________   PHYSICAL EXAM:  VITAL SIGNS: Vitals:   07/24/18 2036  BP: 138/84  Pulse: 61    Resp: 17  Temp: 98.6 F (37 C)  SpO2: 98%    Constitutional: Alert and oriented.  Eyes: Conjunctivae are normal.  Head: Atraumatic. Nose: No congestion/rhinnorhea. Mouth/Throat: Mucous membranes are moist.   Neck: No stridor. Painless ROM.  Cardiovascular: Normal rate, regular rhythm. Grossly normal heart sounds.  Good peripheral circulation. Respiratory: Normal respiratory effort.  No retractions. Lungs CTAB. Gastrointestinal: Soft and nontender. No distention. No abdominal bruits. No CVA tenderness. Genitourinary: deferred Musculoskeletal: No lower extremity tenderness nor edema.  No joint effusions. Neurologic:  Normal speech and language. No gross focal neurologic deficits are appreciated. No facial droop Skin:  Skin is warm, dry and intact. No rash noted. Psychiatric: Mood and affect are normal. Speech and behavior are normal.  ____________________________________________   LABS (all labs ordered are listed, but only abnormal results are displayed)  Results for orders placed or performed during the hospital encounter of 07/24/18 (from the past 24 hour(s))  Urinalysis, Complete w Microscopic     Status: Abnormal   Collection Time: 07/24/18  8:38 PM  Result Value Ref Range   Color, Urine YELLOW (A) YELLOW   APPearance CLEAR (A) CLEAR   Specific Gravity, Urine 1.023 1.005 - 1.030   pH 5.0 5.0 - 8.0   Glucose, UA NEGATIVE NEGATIVE mg/dL   Hgb urine dipstick MODERATE (A) NEGATIVE   Bilirubin Urine NEGATIVE NEGATIVE   Ketones, ur NEGATIVE NEGATIVE mg/dL   Protein, ur NEGATIVE NEGATIVE mg/dL   Nitrite NEGATIVE NEGATIVE   Leukocytes, UA NEGATIVE NEGATIVE  RBC / HPF 11-20 0 - 5 RBC/hpf   WBC, UA 0-5 0 - 5 WBC/hpf   Bacteria, UA RARE (A) NONE SEEN   Squamous Epithelial / LPF NONE SEEN 0 - 5   Mucus PRESENT   CBC with Differential/Platelet     Status: Abnormal   Collection Time: 07/24/18 10:15 PM  Result Value Ref Range   WBC 16.6 (H) 3.8 - 10.6 K/uL   RBC 5.27 4.40 -  5.90 MIL/uL   Hemoglobin 15.2 13.0 - 18.0 g/dL   HCT 40.943.5 81.140.0 - 91.452.0 %   MCV 82.4 80.0 - 100.0 fL   MCH 28.9 26.0 - 34.0 pg   MCHC 35.1 32.0 - 36.0 g/dL   RDW 78.213.6 95.611.5 - 21.314.5 %   Platelets 193 150 - 440 K/uL   Neutrophils Relative % 81 %   Neutro Abs 13.4 (H) 1.4 - 6.5 K/uL   Lymphocytes Relative 14 %   Lymphs Abs 2.3 1.0 - 3.6 K/uL   Monocytes Relative 5 %   Monocytes Absolute 0.9 0.2 - 1.0 K/uL   Eosinophils Relative 0 %   Eosinophils Absolute 0.0 0 - 0.7 K/uL   Basophils Relative 0 %   Basophils Absolute 0.0 0 - 0.1 K/uL   ____________________________________________ ___________________________________  RADIOLOGY  I personally reviewed all radiographic images ordered to evaluate for the above acute complaints and reviewed radiology reports and findings.  These findings were personally discussed with the patient.  Please see medical record for radiology report.  ____________________________________________   PROCEDURES  Procedure(s) performed:  Procedures    Critical Care performed: no ____________________________________________   INITIAL IMPRESSION / ASSESSMENT AND PLAN / ED COURSE  Pertinent labs & imaging results that were available during my care of the patient were reviewed by me and considered in my medical decision making (see chart for details).   DDX: stone, uti, msk strain, unlikely appy or colitis  Darrell FakeRobert A Lappe is a 32 y.o. who presents to the ED with p/w right flank pain. No fevers, no systemic symptoms. - urinary symptoms. Denies trauma or injury. Afebrile in ED. Exam as above. Flank TTP, otherwise abdominal exam is benign. No peritoneal signs. Possible kidney stone, cystitis, or pyelonephritis.Marland Kitchen. UA with hematuria, no evidence of infection.  Patient noted to have fairly notable leukocytosis to 16,000.  As it is on the right side will order CT imaging to further evaluate and rule out acute appendicitis.  Patient be signed out to oncoming physician  pending follow-up CT imaging.       As part of my medical decision making, I reviewed the following data within the electronic MEDICAL RECORD NUMBER Nursing notes reviewed and incorporated, Labs reviewed, notes from prior ED visits.   ____________________________________________   FINAL CLINICAL IMPRESSION(S) / ED DIAGNOSES  Final diagnoses:  Right flank pain      NEW MEDICATIONS STARTED DURING THIS VISIT:  New Prescriptions   No medications on file     Note:  This document was prepared using Dragon voice recognition software and may include unintentional dictation errors.    Willy Eddyobinson, Dmiya Malphrus, MD 07/24/18 2251

## 2018-07-25 MED ORDER — OXYCODONE-ACETAMINOPHEN 5-325 MG PO TABS: 1.0000 | ORAL_TABLET | ORAL | 0 refills | Status: AC | PRN

## 2018-08-29 ENCOUNTER — Emergency Department: Payer: PRIVATE HEALTH INSURANCE

## 2018-08-29 ENCOUNTER — Emergency Department
Admission: EM | Admit: 2018-08-29 | Discharge: 2018-08-29 | Disposition: A | Discharge status: Home / Self Care | Payer: PRIVATE HEALTH INSURANCE | Attending: Emergency Medicine | Admitting: Emergency Medicine

## 2018-08-29 ENCOUNTER — Encounter: Payer: Self-pay | Admitting: Emergency Medicine

## 2018-08-29 ENCOUNTER — Other Ambulatory Visit: Payer: Self-pay

## 2018-08-29 DIAGNOSIS — R110 Nausea: Secondary | ICD-10-CM | POA: Insufficient documentation

## 2018-08-29 DIAGNOSIS — R109 Unspecified abdominal pain: Secondary | ICD-10-CM | POA: Diagnosis present

## 2018-08-29 DIAGNOSIS — Z87891 Personal history of nicotine dependence: Secondary | ICD-10-CM | POA: Insufficient documentation

## 2018-08-29 DIAGNOSIS — N201 Calculus of ureter: Secondary | ICD-10-CM | POA: Diagnosis not present

## 2018-08-29 LAB — URINALYSIS, COMPLETE (UACMP) WITH MICROSCOPIC
BACTERIA UA: NONE SEEN
BILIRUBIN URINE: NEGATIVE
Glucose, UA: NEGATIVE mg/dL
Ketones, ur: 5 mg/dL — AB
LEUKOCYTES UA: NEGATIVE
Nitrite: NEGATIVE
Protein, ur: 30 mg/dL — AB
SPECIFIC GRAVITY, URINE: 1.026 (ref 1.005–1.030)
pH: 5 (ref 5.0–8.0)

## 2018-08-29 LAB — CBC
HEMATOCRIT: 45.1 % (ref 40.0–52.0)
Hemoglobin: 16 g/dL (ref 13.0–18.0)
MCH: 29.5 pg (ref 26.0–34.0)
MCHC: 35.4 g/dL (ref 32.0–36.0)
MCV: 83.3 fL (ref 80.0–100.0)
PLATELETS: 200 10*3/uL (ref 150–440)
RBC: 5.42 MIL/uL (ref 4.40–5.90)
RDW: 13.4 % (ref 11.5–14.5)
WBC: 15.4 10*3/uL — AB (ref 3.8–10.6)

## 2018-08-29 LAB — BASIC METABOLIC PANEL
ANION GAP: 10 (ref 5–15)
BUN: 20 mg/dL (ref 6–20)
CALCIUM: 9.3 mg/dL (ref 8.9–10.3)
CO2: 24 mmol/L (ref 22–32)
CREATININE: 1.35 mg/dL — AB (ref 0.61–1.24)
Chloride: 106 mmol/L (ref 98–111)
Glucose, Bld: 172 mg/dL — ABNORMAL HIGH (ref 70–99)
Potassium: 4.1 mmol/L (ref 3.5–5.1)
SODIUM: 140 mmol/L (ref 135–145)

## 2018-08-29 MED ORDER — HYDROCODONE-ACETAMINOPHEN 5-325 MG PO TABS: 1.0000 | ORAL_TABLET | Freq: Four times a day (QID) | ORAL | 0 refills | Status: AC | PRN

## 2018-08-29 MED ORDER — SODIUM CHLORIDE 0.9 % IV BOLUS
1000.0000 mL | Freq: Once | INTRAVENOUS | Status: AC
  Administered 2018-08-29: 1000 mL via INTRAVENOUS

## 2018-08-29 MED ORDER — TAMSULOSIN HCL 0.4 MG PO CAPS: 0.4000 mg | ORAL_CAPSULE | Freq: Every day | ORAL | 0 refills | Status: AC

## 2018-08-29 MED ORDER — MORPHINE SULFATE (PF) 4 MG/ML IV SOLN
4.0000 mg | Freq: Once | INTRAVENOUS | Status: AC
  Administered 2018-08-29: 4 mg via INTRAVENOUS
  Filled 2018-08-29: qty 1

## 2018-08-29 MED ORDER — ONDANSETRON HCL 4 MG/2ML IJ SOLN
4.0000 mg | Freq: Once | INTRAMUSCULAR | Status: DC
  Filled 2018-08-29: qty 2

## 2018-08-29 MED ORDER — MORPHINE SULFATE (PF) 4 MG/ML IV SOLN
INTRAVENOUS | Status: AC
  Administered 2018-08-29: 4 mg via INTRAVENOUS
  Filled 2018-08-29: qty 1

## 2018-08-29 MED ORDER — KETOROLAC TROMETHAMINE 30 MG/ML IJ SOLN
30.0000 mg | Freq: Once | INTRAMUSCULAR | Status: AC
  Administered 2018-08-29: 30 mg via INTRAVENOUS
  Filled 2018-08-29: qty 1

## 2018-08-29 NOTE — Discharge Instructions (Addendum)
Return to the emergency room immediately for any worsening condition including uncontrolled pain, fever, inability to urinate, or any other symptoms concerning to you.

## 2018-08-29 NOTE — ED Notes (Signed)
ED Discharge reviewed with Dr Shaune Pollack, Molli Knock to discharge and drive self

## 2018-08-29 NOTE — ED Notes (Signed)
Pt states he "does not feel nauseous" right now and does not want zofran unless he gets N again.

## 2018-08-29 NOTE — ED Notes (Signed)
Morphine 4mg  wasted in pyxis. Witness: Annette Stable, Charity fundraiser

## 2018-08-29 NOTE — ED Notes (Signed)
EDP at bedside  

## 2018-08-29 NOTE — ED Triage Notes (Signed)
Pt arrived via POV with reports of kidney stone pain that started 145 this morning, c/o right flank pain.  C/o N/V. Has hx of kidney stones.

## 2018-08-29 NOTE — ED Provider Notes (Signed)
[facility] Emergency Department Provider Note ____________________________________________   I have reviewed the triage vital signs and the triage nursing note.  HISTORY  Chief Complaint Flank Pain   Historian Patient  HPI Darrell Barnes is a 32 y.o. male with a history of kidney stones, most recently about a month ago on the right side, he is not sure that it ever passed.  Worsening pain overnight.  Currently pain is 9 out of 10.  No fever.  Moderate nausea.  Nothing makes it worse or better.     Past Medical History:  Diagnosis Date  . Kidney stone     Patient Active Problem List   Diagnosis Date Noted  . Nephrolithiasis 10/04/2017    History reviewed. No pertinent surgical history.  Prior to Admission medications   Medication Sig Start Date End Date Taking? Authorizing Provider  albuterol (PROVENTIL HFA;VENTOLIN HFA) 108 (90 BASE) MCG/ACT inhaler Inhale 2 puffs into the lungs every 6 (six) hours as needed for wheezing or shortness of breath. Patient not taking: Reported on 10/03/2017 04/17/15   Menshew, Charlesetta Ivory, PA-C  HYDROcodone-acetaminophen (NORCO/VICODIN) 5-325 MG tablet Take 1-2 tablets by mouth every 6 (six) hours as needed for moderate pain. 08/29/18   Governor Rooks, MD  ondansetron (ZOFRAN) 4 MG tablet Take 1 tablet (4 mg total) by mouth daily as needed for nausea or vomiting. 07/24/18 07/24/19  Willy Eddy, MD  oxyCODONE-acetaminophen (PERCOCET) 5-325 MG tablet Take 1 tablet by mouth every 4 (four) hours as needed for severe pain. 07/25/18 07/25/19  Darci Current, MD  tamsulosin (FLOMAX) 0.4 MG CAPS capsule Take 1 capsule (0.4 mg total) by mouth daily. 08/29/18   Governor Rooks, MD    No Known Allergies  History reviewed. No pertinent family history.  Social History Social History   Tobacco Use  . Smoking status: Former Games developer  . Smokeless tobacco: Never Used  Substance Use Topics  . Alcohol use: No  . Drug use: No     Review of Systems  Constitutional: Negative for fever. Eyes: Negative for visual changes. ENT: Negative for sore throat. Cardiovascular: Negative for chest pain. Respiratory: Negative for shortness of breath. Gastrointestinal: Negative for diarrhea. Genitourinary: Negative for hematuria Musculoskeletal: Negative for back pain. Skin: Negative for rash. Neurological: Negative for headache.  ____________________________________________   PHYSICAL EXAM:  VITAL SIGNS: ED Triage Vitals  Enc Vitals Group     BP 08/29/18 0712 (!) 142/88     Pulse Rate 08/29/18 0712 60     Resp 08/29/18 0712 16     Temp 08/29/18 0712 98.7 F (37.1 C)     Temp Source 08/29/18 0712 Oral     SpO2 08/29/18 0712 96 %     Weight 08/29/18 0710 275 lb (124.7 kg)     Height 08/29/18 0710 5\' 5"  (1.651 m)     Head Circumference --      Peak Flow --      Pain Score 08/29/18 0710 10     Pain Loc --      Pain Edu? --      Excl. in GC? --      Constitutional: Alert and oriented.  HEENT      Head: Normocephalic and atraumatic.      Eyes: Conjunctivae are normal. Pupils equal and round.       Ears:         Nose: No congestion/rhinnorhea.      Mouth/Throat: Mucous membranes are moist.  Neck: No stridor. Cardiovascular/Chest: Normal rate, regular rhythm.  No murmurs, rubs, or gallops. Respiratory: Normal respiratory effort without tachypnea nor retractions. Breath sounds are clear and equal bilaterally. No wheezes/rales/rhonchi. Gastrointestinal: Soft. No distention, no guarding, no rebound. Nontender.  Obese. Genitourinary/rectal:Deferred Musculoskeletal: Nontender with normal range of motion in all extremities. No joint effusions.  No lower extremity tenderness.  No edema. Neurologic:  Normal speech and language. No gross or focal neurologic deficits are appreciated. Skin:  Skin is warm, dry and intact. No rash noted. Psychiatric: Mood and affect are normal. Speech and behavior are normal.  Patient exhibits appropriate insight and judgment.   ____________________________________________  LABS (pertinent positives/negatives) I, Governor Rooks, MD the attending physician have reviewed the labs noted below.  Labs Reviewed  URINALYSIS, COMPLETE (UACMP) WITH MICROSCOPIC - Abnormal; Notable for the following components:      Result Value   Color, Urine YELLOW (*)    APPearance CLEAR (*)    Hgb urine dipstick SMALL (*)    Ketones, ur 5 (*)    Protein, ur 30 (*)    All other components within normal limits  CBC - Abnormal; Notable for the following components:   WBC 15.4 (*)    All other components within normal limits  BASIC METABOLIC PANEL - Abnormal; Notable for the following components:   Glucose, Bld 172 (*)    Creatinine, Ser 1.35 (*)    All other components within normal limits    ____________________________________________    EKG I, Governor Rooks, MD, the attending physician have personally viewed and interpreted all ECGs.  None ____________________________________________  RADIOLOGY   Renal ultrasound: Radiologist report reviewed:IMPRESSION: Increasing right-sided hydronephrosis as well as distal right ureteral stone slightly more distally located when compared with the prior exam. __________________________________________  PROCEDURES  Procedure(s) performed: None  Procedures  Critical Care performed: None   ____________________________________________  ED COURSE / ASSESSMENT AND PLAN  Pertinent labs & imaging results that were available during my care of the patient were reviewed by me and considered in my medical decision making (see chart for details).    Patient with a history of stents with renal flank pain likely consistent clinically with ureterolithiasis.  UA without signs of infection.  Positive for hematuria.  White blood count is elevated at 15, does not seem to be related to infection.  Creatinine is 1.35 which is slightly up  from previous.  Renal ultrasound confirms hydronephrosis with apparent right distal ureteral stone.  Reevaluation after Toradol and morphine, at 12:40 PM, patient is comfortable with pain down to 1 out of 10.  Spoke with urologist and plan for outpatient follow-up.  He was given ivf in the ER.    CONSULTATIONS: Urology, Dr. Richardo Hanks, who reviewed the ultrasound -- will set up patient for visit next week for surgery if not passed in the next week.   Added flomax, will make sure urine is strained.   Patient / Family / Caregiver informed of clinical course, medical decision-making process, and agree with plan.   I discussed return precautions, follow-up instructions, and discharge instructions with patient and/or family.  Discharge Instructions : Return to the emergency room immediately for any worsening condition including uncontrolled pain, fever, inability to urinate, or any other symptoms concerning to you.    ___________________________________________   FINAL CLINICAL IMPRESSION(S) / ED DIAGNOSES   Final diagnoses:  Ureterolithiasis      ___________________________________________         Note: This dictation was prepared with Dragon dictation.  Any transcriptional errors that result from this process are unintentional    Governor Rooks, MD 08/29/18 1256

## 2018-08-30 ENCOUNTER — Telehealth: Payer: Self-pay | Admitting: Urology

## 2018-08-30 NOTE — Telephone Encounter (Signed)
App made Tried calling patient to give app information Mailbox was full could not lm  MB

## 2018-08-30 NOTE — Telephone Encounter (Signed)
-----   Message from Sondra Come, MD sent at 08/29/2018 12:56 PM EDT ----- Regarding: follow up Please schedule with next available provider within 10 days to discuss right ureteroscopy/laser lithotripsy/stent placement  Thanks Legrand Rams, MD 08/29/2018

## 2018-09-03 IMAGING — CT CT ABD-PELV W/ CM
2 of 4 series · 16 of 46 positions shown, 18 images · IV contrast (APPLIED)
Comparison: Abdominal radiograph July 24, 2018 and CT abdomen and
pelvis August 09, 2017

CLINICAL DATA: Nausea, vomiting tonight. Similar symptoms with
kidney stones. Pain radiates to RIGHT testicle.

EXAM:
CT ABDOMEN AND PELVIS WITH CONTRAST
TECHNIQUE: Multidetector CT imaging of the abdomen and pelvis was performed
using the standard protocol following bolus administration of
intravenous contrast.
CONTRAST:  100mL [id number] IOPAMIDOL ([id number]) INJECTION 61%

[Series 2: routine abd/pel with · axial · 0.90mm/px · z∈[-964,-528]mm · 13 of 97 slices shown, 15 images]
[im 5/97  soft-tissue]
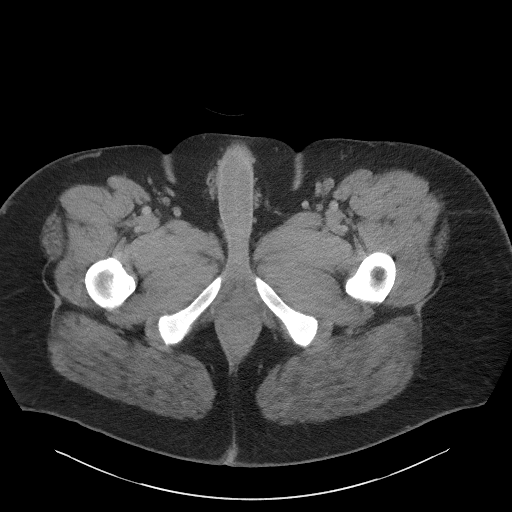
[im 5/97  bone]
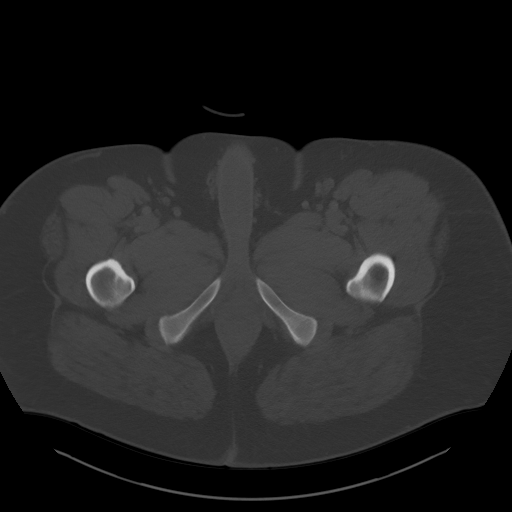
[im 13/97  soft-tissue]
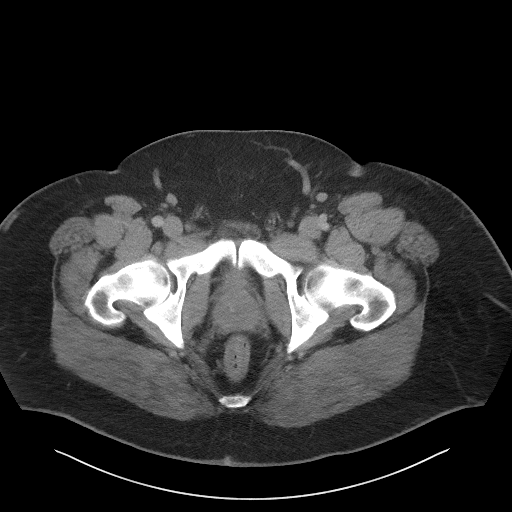
[im 21/97  soft-tissue]
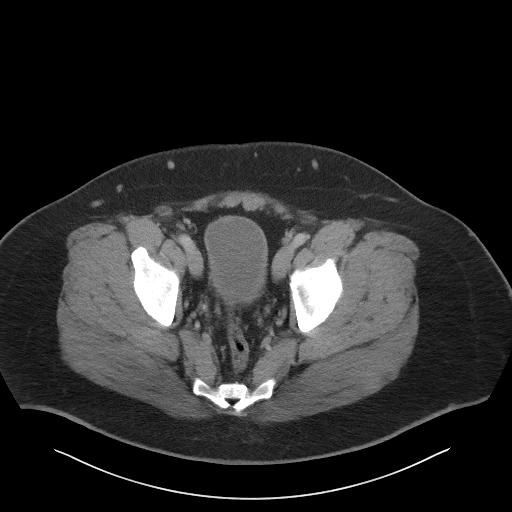
[im 26/97  soft-tissue]
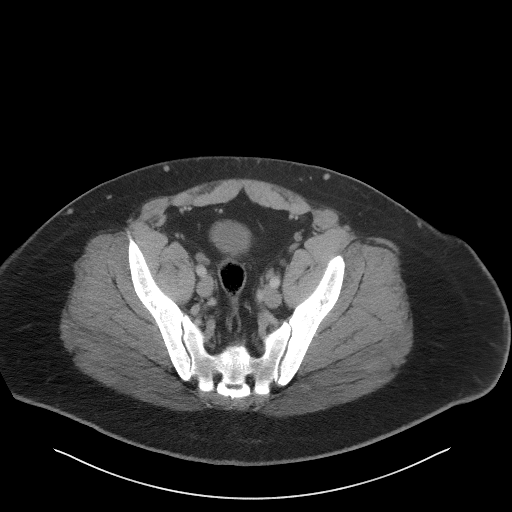
[im 34/97  soft-tissue]
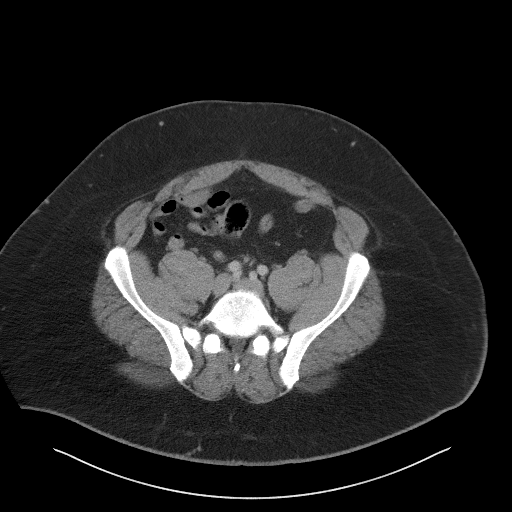
[im 42/97  soft-tissue]
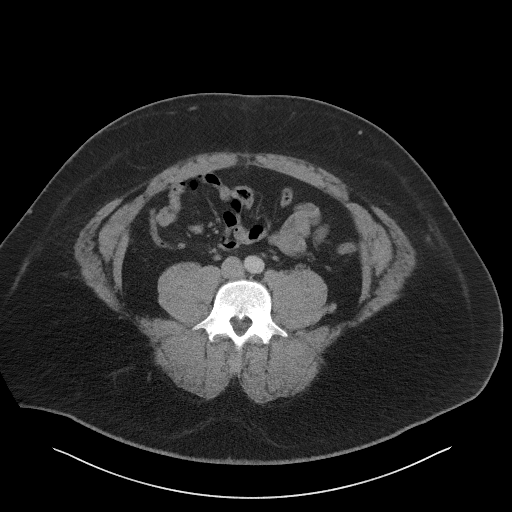
[im 51/97  soft-tissue]
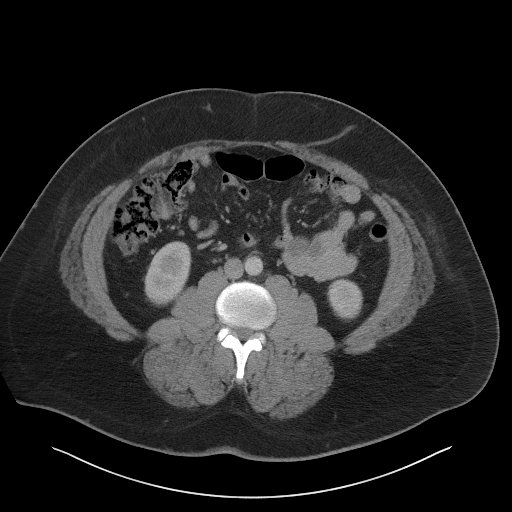
[im 55/97  soft-tissue]
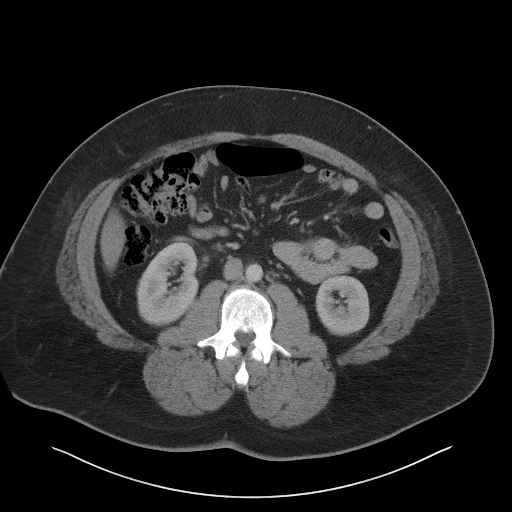
[im 63/97  soft-tissue]
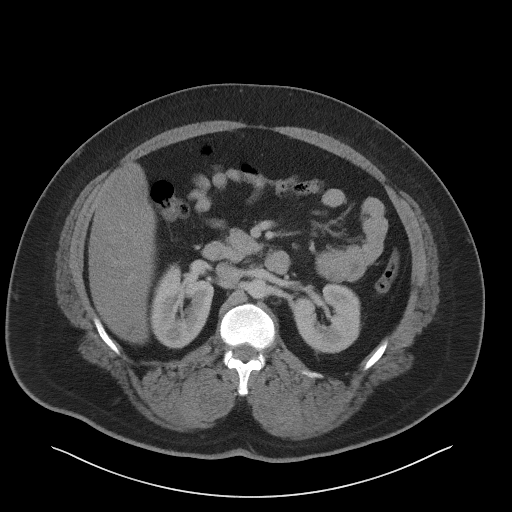
[im 63/97  bone]
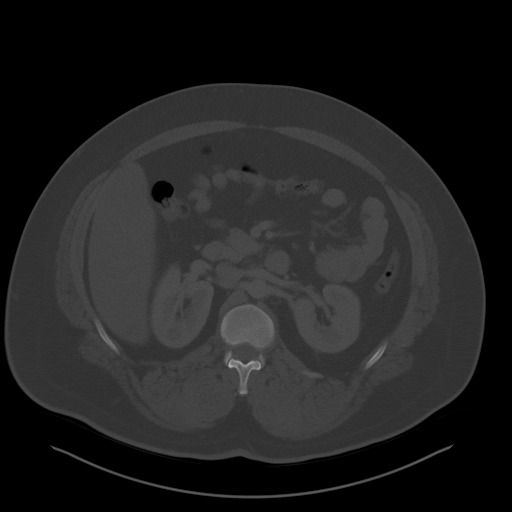
[im 71/97  soft-tissue]
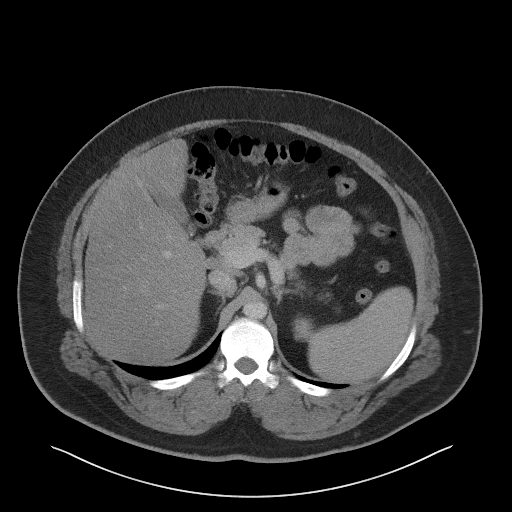
[im 76/97  soft-tissue]
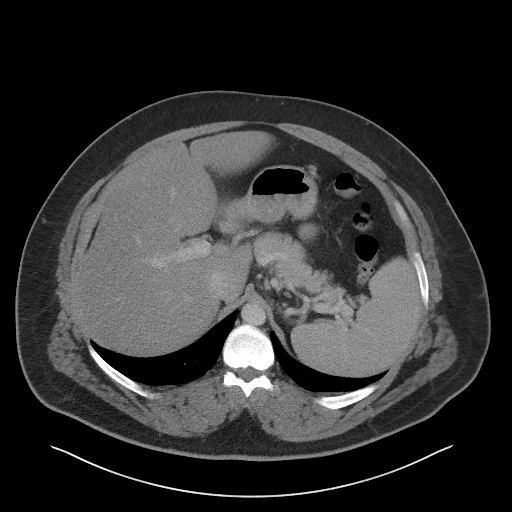
[im 84/97  soft-tissue]
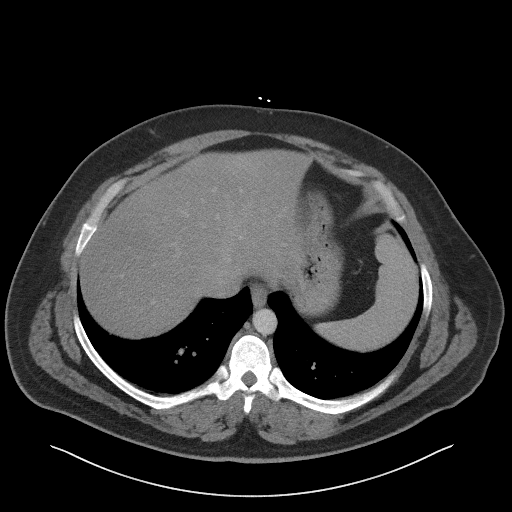
[im 92/97  soft-tissue]
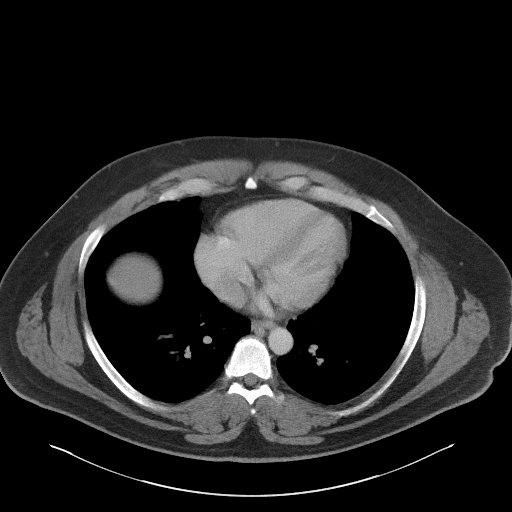

[Series 5: coronal st · coronal · 0.87mm/px · 3 of 105 slices shown]
[im 35/105  soft-tissue]
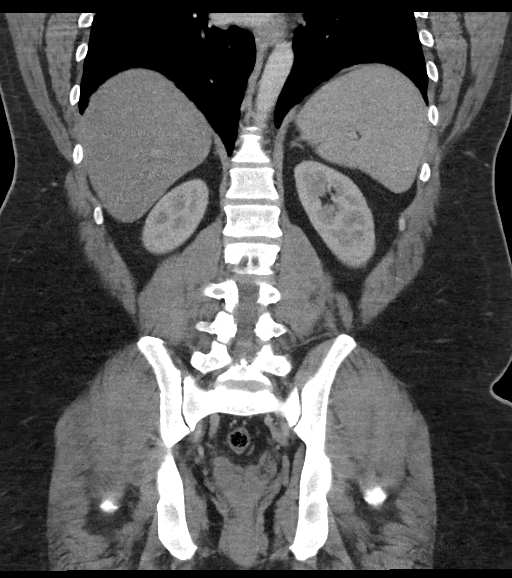
[im 47/105  soft-tissue]
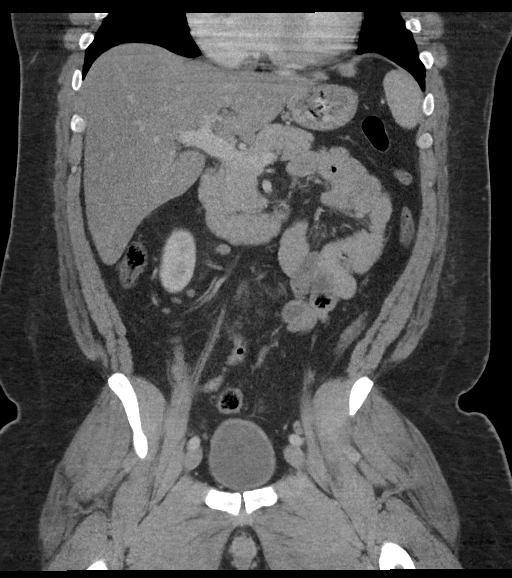
[im 58/105  soft-tissue]
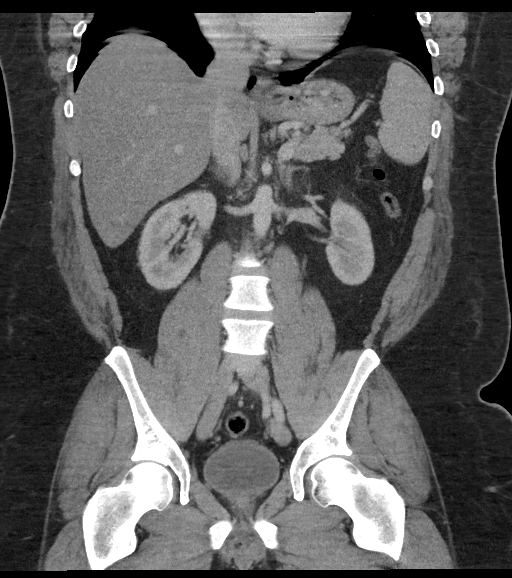

[16 of 46 positions shown; findings below may reference images not displayed]

FINDINGS: LOWER CHEST: Lung bases are clear. Included heart size is normal. No
pericardial effusion.

HEPATOBILIARY: Diffusely hypodense liver compatible with steatosis.
Focal fatty sparing about the gallbladder fossa. Subcentimeter
gallstones without CT findings of acute cholecystitis.

PANCREAS: Normal.

SPLEEN: Normal.

ADRENALS/URINARY TRACT: Kidneys are orthotopic, demonstrating
symmetric enhancement. Punctate bilateral nephrolithiasis. No
hydronephrosis or solid renal masses. 3 mm distal RIGHT ureteral
calculus. Delayed imaging through the kidneys demonstrates symmetric
prompt contrast excretion within the proximal urinary collecting
system. Urinary bladder is partially distended and unremarkable.
Normal adrenal glands.

STOMACH/BOWEL: The stomach, small and large bowel are normal in
course and caliber without inflammatory changes. Normal appendix.

VASCULAR/LYMPHATIC: Aortoiliac vessels are normal in course and
caliber. No lymphadenopathy by CT size criteria.

REPRODUCTIVE: Normal.

OTHER: No intraperitoneal free fluid or free air.

MUSCULOSKELETAL: Nonacute.  Small fat containing inguinal hernias.
IMPRESSION: 1. 3 mm distal RIGHT ureteral calculus without obstructive uropathy.
2. Punctate nephrolithiasis.
3. Hepatic steatosis.

## 2018-09-06 ENCOUNTER — Encounter: Payer: Self-pay | Admitting: Urology

## 2018-09-06 ENCOUNTER — Ambulatory Visit: Payer: PRIVATE HEALTH INSURANCE | Admitting: Urology

## 2019-01-01 IMAGING — US US RENAL
1 series · 14 of 25 positions shown · non-contrast
Comparison: CT abdomen and pelvis 08/09/2017

CLINICAL DATA: Gross hematuria.  Previous history of stones.

EXAM:
RENAL / URINARY TRACT ULTRASOUND COMPLETE

[Series 1: us renal · 0.26mm/px · 14 of 36 slices shown]
[im 1/36]
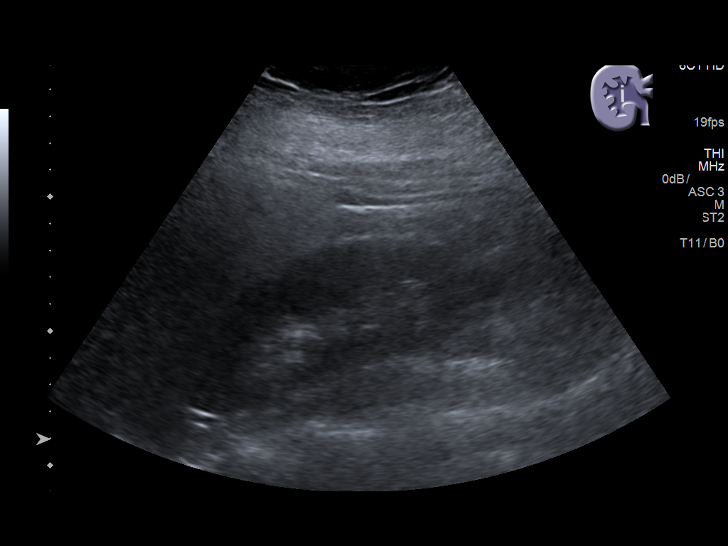
[im 3/36]
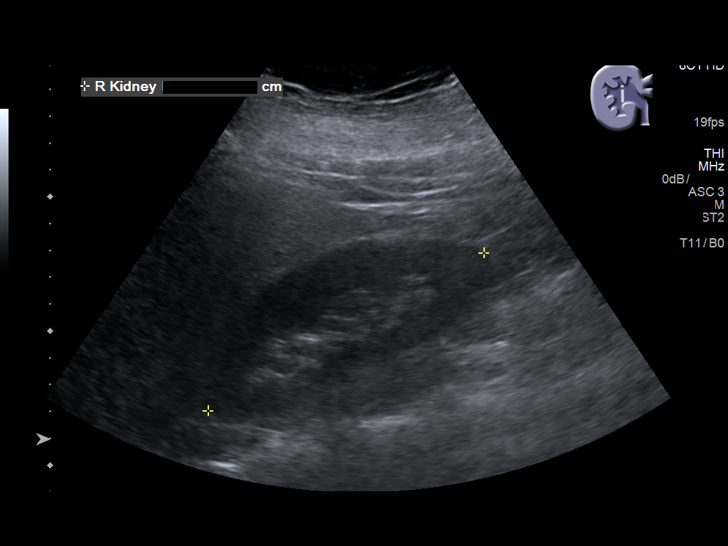
[im 6/36]
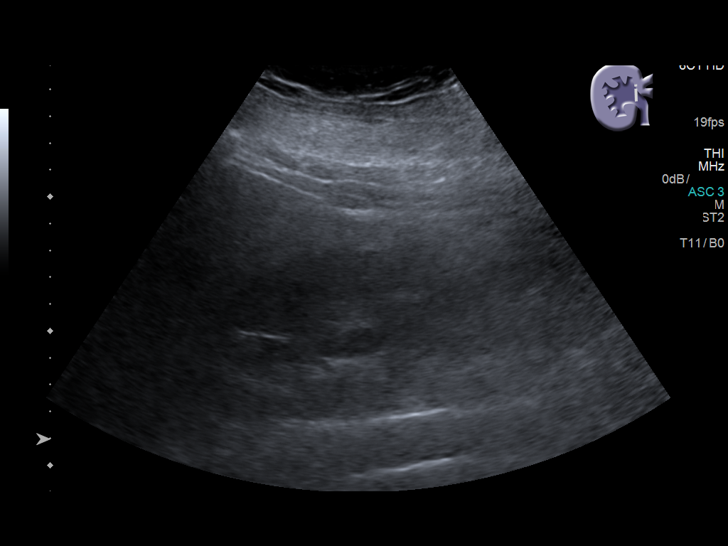
[im 9/36]
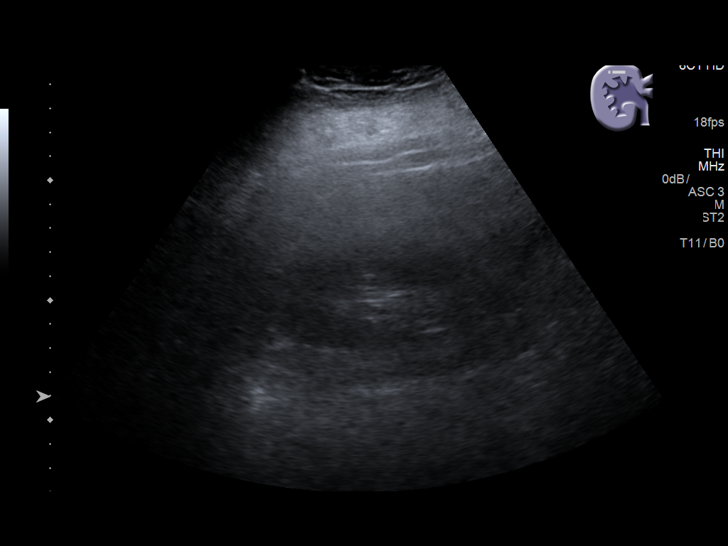
[im 12/36]
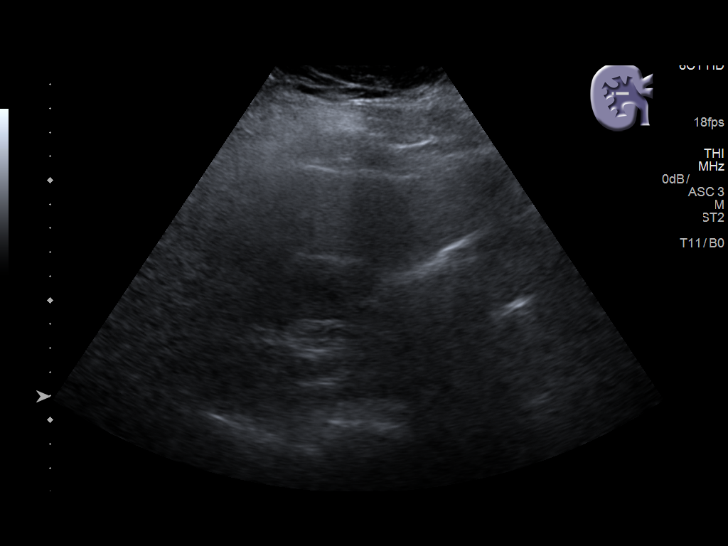
[im 14/36]
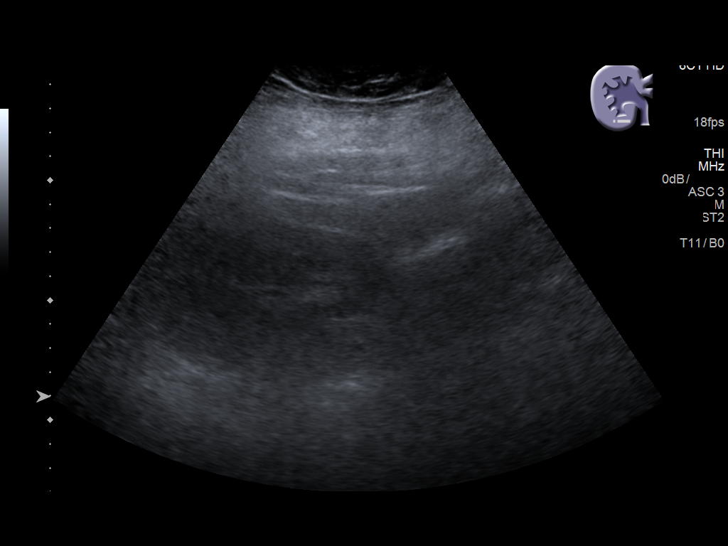
[im 17/36]
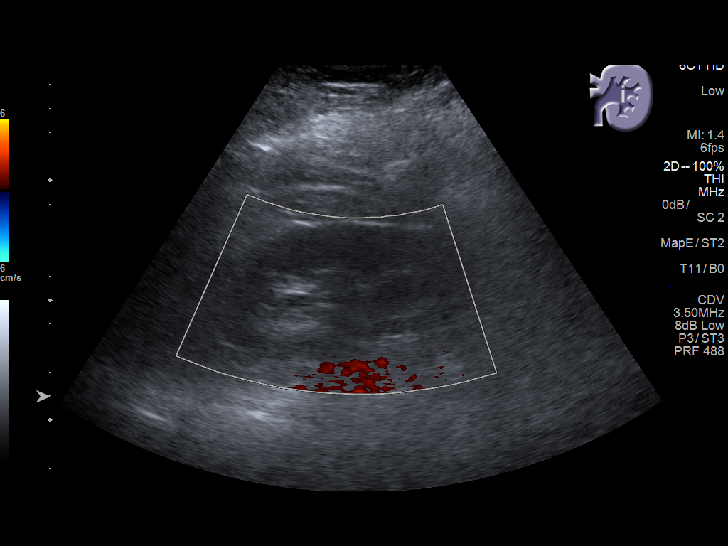
[im 19/36]
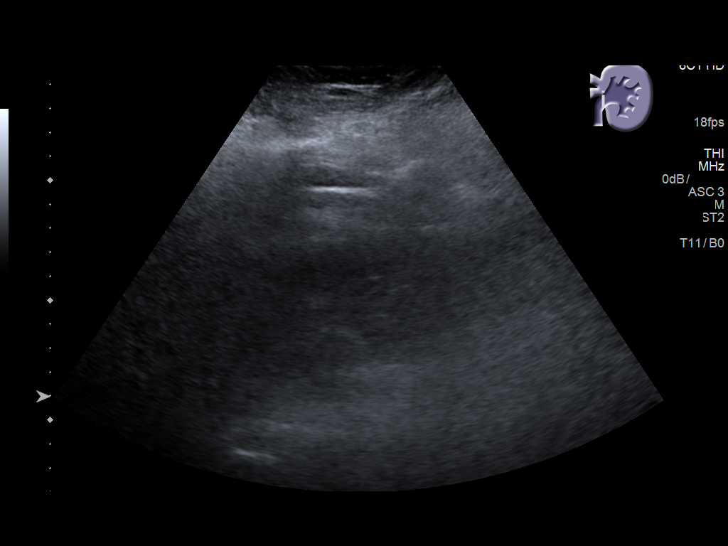
[im 22/36]
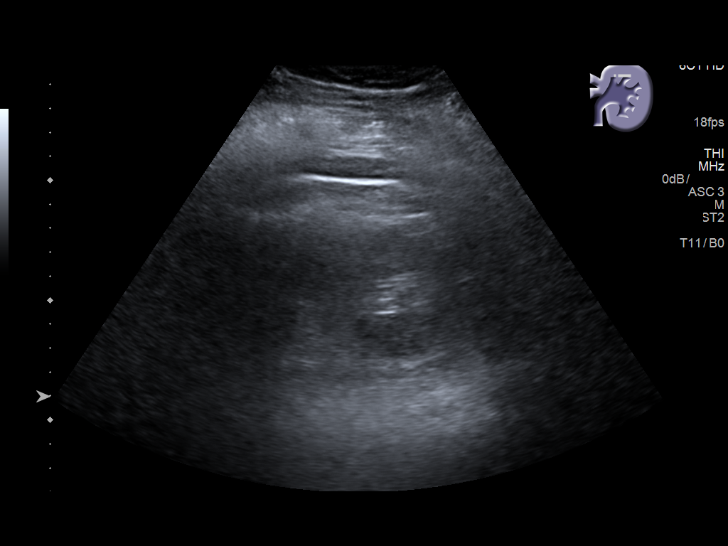
[im 24/36]
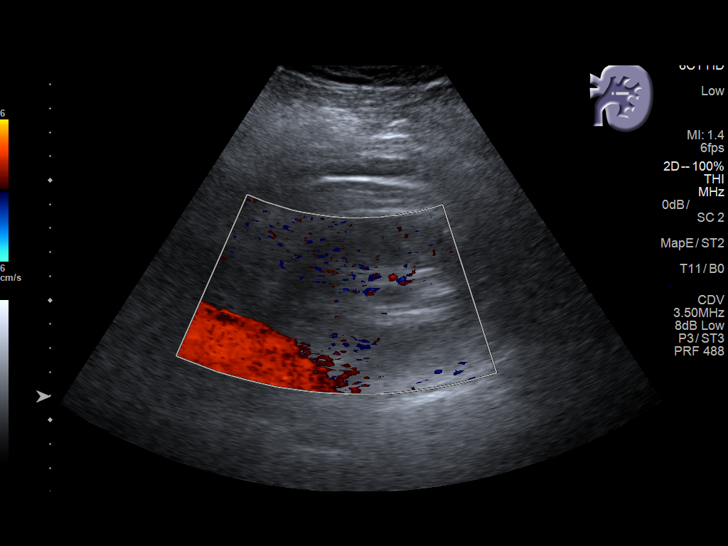
[im 27/36]
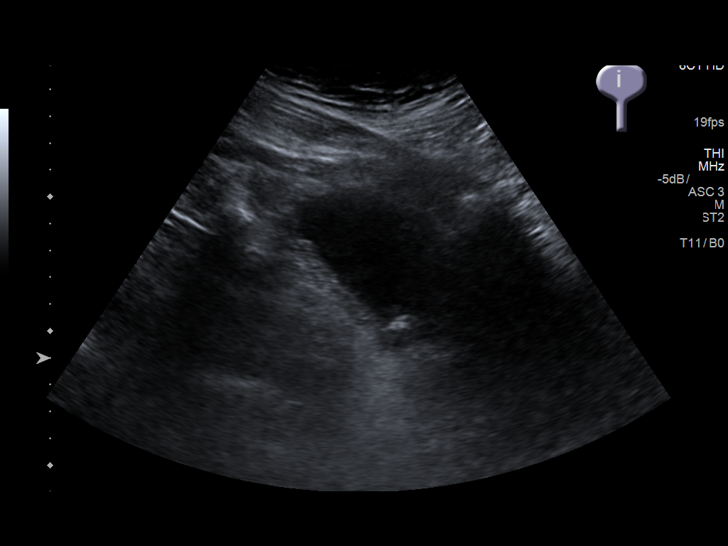
[im 30/36]
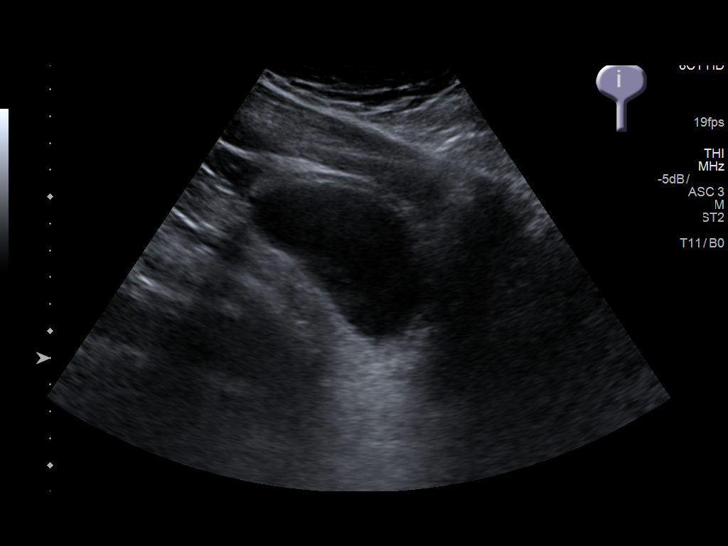
[im 33/36]
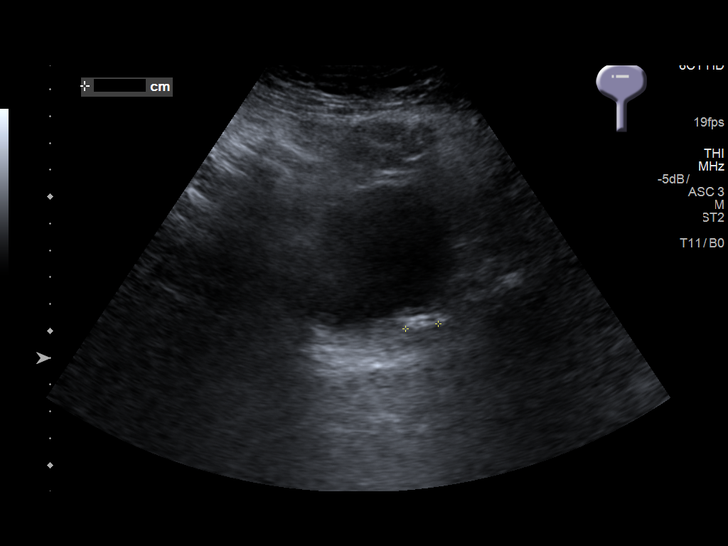
[im 36/36]
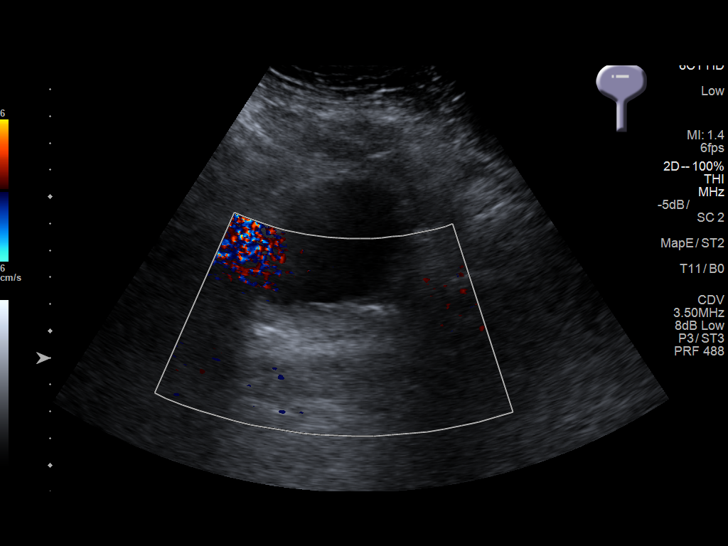

[14 of 25 positions shown; findings below may reference images not displayed]

FINDINGS: Right Kidney:

Length: 11.8 cm. Echogenicity within normal limits. No mass or
hydronephrosis visualized.

Left Kidney:

Length: 11.6 cm. Minimal hydronephrosis is demonstrated. No focal
lesions are appreciated. Parenchymal thickness and echotexture is
normal.

Bladder:

There is an echogenic focus in the distal left ureter at the
ureterovesical junction consistent with a stone. This is measured at
1.2 cm but this ultrasound measurement may be overestimating the
size of the stone. Bladder wall is not thickened and no intraluminal
filling defects are demonstrated. The left urine flow jet is
demonstrated on color flow Doppler imaging.
IMPRESSION: Stone demonstrated in the distal left ureter at the ureterovesical
junction with mild proximal hydronephrosis in the left kidney.

## 2019-07-15 ENCOUNTER — Other Ambulatory Visit: Payer: Self-pay

## 2019-07-15 ENCOUNTER — Encounter: Payer: Self-pay | Admitting: Emergency Medicine

## 2019-07-15 DIAGNOSIS — Z87891 Personal history of nicotine dependence: Secondary | ICD-10-CM | POA: Insufficient documentation

## 2019-07-15 DIAGNOSIS — R079 Chest pain, unspecified: Secondary | ICD-10-CM | POA: Insufficient documentation

## 2019-07-15 DIAGNOSIS — Z79899 Other long term (current) drug therapy: Secondary | ICD-10-CM | POA: Diagnosis not present

## 2019-07-15 LAB — BASIC METABOLIC PANEL
Anion gap: 6 (ref 5–15)
BUN: 17 mg/dL (ref 6–20)
CO2: 24 mmol/L (ref 22–32)
Calcium: 8.9 mg/dL (ref 8.9–10.3)
Chloride: 104 mmol/L (ref 98–111)
Creatinine, Ser: 0.91 mg/dL (ref 0.61–1.24)
GFR calc Af Amer: >60 mL/min (ref >60)
GFR calc non Af Amer: >60 mL/min (ref >60)
Glucose, Bld: 105 mg/dL — ABNORMAL HIGH (ref 70–99)
Potassium: 3.6 mmol/L (ref 3.5–5.1)
Sodium: 134 mmol/L — ABNORMAL LOW (ref 135–145)

## 2019-07-15 LAB — CBC
HCT: 44 % (ref 39.0–52.0)
Hemoglobin: 15 g/dL (ref 13.0–17.0)
MCH: 27.7 pg (ref 26.0–34.0)
MCHC: 34.1 g/dL (ref 30.0–36.0)
MCV: 81.2 fL (ref 80.0–100.0)
Platelets: 168 10*3/uL (ref 150–400)
RBC: 5.42 MIL/uL (ref 4.22–5.81)
RDW: 12.8 % (ref 11.5–15.5)
WBC: 8.8 10*3/uL (ref 4.0–10.5)
nRBC: 0 % (ref 0.0–0.2)

## 2019-07-15 LAB — TROPONIN I (HIGH SENSITIVITY): Troponin I (High Sensitivity): 4 ng/L (ref <18)

## 2019-07-15 MED ORDER — SODIUM CHLORIDE 0.9% FLUSH: 3.0000 mL | Freq: Once | INTRAVENOUS | Status: DC

## 2019-07-15 NOTE — ED Triage Notes (Addendum)
Pt presents to ED with left sided chest pain that radiates into his left arm. Pt states pain worsens when leaning over. Denies hx of the same. Denies sob.

## 2019-07-16 ENCOUNTER — Emergency Department
Admission: EM | Admit: 2019-07-16 | Discharge: 2019-07-16 | Disposition: A | Discharge status: Home / Self Care | Payer: PRIVATE HEALTH INSURANCE | Attending: Emergency Medicine | Admitting: Emergency Medicine

## 2019-07-16 ENCOUNTER — Emergency Department: Payer: PRIVATE HEALTH INSURANCE

## 2019-07-16 DIAGNOSIS — R0789 Other chest pain: Secondary | ICD-10-CM

## 2019-07-16 LAB — TROPONIN I (HIGH SENSITIVITY): Troponin I (High Sensitivity): 5 ng/L (ref <18)

## 2019-07-16 MED ORDER — ASPIRIN 81 MG PO CHEW
324.0000 mg | CHEWABLE_TABLET | Freq: Once | ORAL | Status: AC
  Administered 2019-07-16: 324 mg via ORAL
  Filled 2019-07-16: qty 4

## 2019-07-16 NOTE — ED Notes (Signed)
No answer when called several times from lobby 

## 2019-07-16 NOTE — ED Provider Notes (Signed)
[facility] Emergency Department Provider Note  ____________________________________________  Time seen: Approximately 2:41 AM  I have reviewed the triage vital signs and the nursing notes.   HISTORY  Chief Complaint Chest Pain   HPI Darrell Barnes is a 33 y.o. male with a history of kidney stones and obesity who presents for evaluation of chest pain.  Patient reports that he started having chest pain after he came home from work today.  He describes that the pain was only present when he was bending down to pick up something.  As soon as he stood up or sat down the pain resolved.  He describes the pain as sharp in the center of his chest radiating to his jaw.  He denies radiation to the arms or back, shortness of breath, fever or chills, abdominal pain, cough, vomiting, nausea, diarrhea.  Patient does report mild body aches which he had today.  He works with [employer] and has had no known exposures to COVID.  He reports that when he came home from work his wife check his temperature which was 100.81F.  He did not take any medications at home.  The pain is now resolved.  The pain happened several times this evening, every time that he leaned over.  No personal or family history of heart attacks, no personal family history of blood clots, no recent travel immobilization, no leg pain or swelling, no hemoptysis, no exogenous hormones.   Past Medical History:  Diagnosis Date   Kidney stone     Patient Active Problem List   Diagnosis Date Noted   Nephrolithiasis 10/04/2017    History reviewed. No pertinent surgical history.  Prior to Admission medications   Medication Sig Start Date End Date Taking? Authorizing Provider  albuterol (PROVENTIL HFA;VENTOLIN HFA) 108 (90 BASE) MCG/ACT inhaler Inhale 2 puffs into the lungs every 6 (six) hours as needed for wheezing or shortness of breath. Patient not taking: Reported on 10/03/2017 04/17/15   Menshew, Charlesetta IvoryJenise V Bacon,  PA-C  HYDROcodone-acetaminophen (NORCO/VICODIN) 5-325 MG tablet Take 1-2 tablets by mouth every 6 (six) hours as needed for moderate pain. 08/29/18   Governor RooksLord, Rebecca, MD  ondansetron (ZOFRAN) 4 MG tablet Take 1 tablet (4 mg total) by mouth daily as needed for nausea or vomiting. 07/24/18 07/24/19  Willy Eddyobinson, Patrick, MD  oxyCODONE-acetaminophen (PERCOCET) 5-325 MG tablet Take 1 tablet by mouth every 4 (four) hours as needed for severe pain. 07/25/18 07/25/19  Darci CurrentBrown, [place] N, MD  tamsulosin (FLOMAX) 0.4 MG CAPS capsule Take 1 capsule (0.4 mg total) by mouth daily. 08/29/18   Governor RooksLord, Rebecca, MD    Allergies Patient has no known allergies.  FH Coronary Artery Disease (Blocked arteries around heart) Father    Epilepsy Father    Stroke Maternal Grandfather    Diabetes type II Maternal Grandmother    Diabetes type II Maternal Uncle    Diabetes type II Maternal Uncle    Deep vein thrombosis (DVT or abnormal blood clot formation) Mother  was septic at the time; died  Depression Mother    Diabetes type II Mother    Glaucoma Mother    High blood pressure (Hypertension) Mother    Obesity Mother    Stroke Mother    Diabetes type II Paternal Grandmother    Anxiety Sister       Social History Social History   Tobacco Use   Smoking status: Former Smoker   Smokeless tobacco: Never Used  Substance Use Topics  Alcohol use: No   Drug use: No    Review of Systems  Constitutional: + fever, body aches Eyes: Negative for visual changes. ENT: Negative for sore throat. Neck: No neck pain  Cardiovascular: + chest pain. Respiratory: Negative for shortness of breath. Gastrointestinal: Negative for abdominal pain, vomiting or diarrhea. Genitourinary: Negative for dysuria. Musculoskeletal: Negative for back pain. Skin: Negative for rash. Neurological: Negative for headaches, weakness or numbness. Psych: No SI or  HI  ____________________________________________   PHYSICAL EXAM:  VITAL SIGNS: ED Triage Vitals  Enc Vitals Group     BP 07/15/19 2159 139/70     Pulse Rate 07/15/19 2159 83     Resp 07/15/19 2159 20     Temp 07/15/19 2159 100 F (37.8 C)     Temp Source 07/15/19 2159 Oral     SpO2 07/15/19 2159 94 %     Weight 07/15/19 2156 275 lb (124.7 kg)     Height 07/15/19 2156 5\' 5"  (1.651 m)     Head Circumference --      Peak Flow --      Pain Score 07/15/19 2156 6     Pain Loc --      Pain Edu? --      Excl. in [place]? --     Constitutional: Alert and oriented. Well appearing and in no apparent distress. HEENT:      Head: Normocephalic and atraumatic.         Eyes: Conjunctivae are normal. Sclera is non-icteric.       Mouth/Throat: Mucous membranes are moist.       Neck: Supple with no signs of meningismus. Cardiovascular: Regular rate and rhythm. No murmurs, gallops, or rubs. 2+ symmetrical distal pulses are present in all extremities. No JVD. Respiratory: Normal respiratory effort. Lungs are clear to auscultation bilaterally. No wheezes, crackles, or rhonchi.  Gastrointestinal: Soft, non tender, and non distended with positive bowel sounds. No rebound or guarding. Genitourinary: No CVA tenderness. Musculoskeletal: Nontender with normal range of motion in all extremities. No edema, cyanosis, or erythema of extremities. Neurologic: Normal speech and language. Face is symmetric. Moving all extremities. No gross focal neurologic deficits are appreciated. Skin: Skin is warm, dry and intact. No rash noted. Psychiatric: Mood and affect are normal. Speech and behavior are normal.  ____________________________________________   LABS (all labs ordered are listed, but only abnormal results are displayed)  Labs Reviewed  BASIC METABOLIC PANEL - Abnormal; Notable for the following components:      Result Value   Sodium 134 (*)    Glucose, Bld 105 (*)    All other components within  normal limits  CBC  TROPONIN I (HIGH SENSITIVITY)  TROPONIN I (HIGH SENSITIVITY)  TROPONIN I (HIGH SENSITIVITY)   ____________________________________________  EKG  ED ECG REPORT I, Rudene Re, the attending physician, personally viewed and interpreted this ECG.  Normal sinus rhythm, rate of 87, normal intervals, normal axis, no ST elevations or depressions.  No prior for comparison.  Normal EKG. ____________________________________________  RADIOLOGY  I have personally reviewed the images performed during this visit and I agree with the Radiologist's read.   Interpretation by Radiologist:  Dg Chest 2 View  Result Date: 07/16/2019 CLINICAL DATA:  Left chest pain EXAM: CHEST - 2 VIEW COMPARISON:  04/17/2015 FINDINGS: Lungs are clear.  No pleural effusion or pneumothorax. The heart is normal in size. Visualized osseous structures are within normal limits. IMPRESSION: Normal chest radiographs. Electronically Signed   By: Bertis Ruddy  Rito EhrlichKrishnan M.D.   On: 07/16/2019 03:09      ____________________________________________   PROCEDURES  Procedure(s) performed: None Procedures Critical Care performed:  None ____________________________________________   INITIAL IMPRESSION / ASSESSMENT AND PLAN / ED COURSE  33 y.o. male with a history of kidney stones and obesity who presents for evaluation of several episodes of short-lived sharp chest pain that happened while leaning over and resolved when patient stood up.  No pain at this time.  EKG is nonischemic.  Presentation atypical for ACS.  Heart score of 1.  To high sensitive troponins were sent for further stratification and they were both negative. CXR negative for PTX, edema, PNA. Patient reports fever at home, no fever here, no tachycardia, tachypnea, hypoxia or increased WOB. Low suspicion for infectious etiology including covid 19. Normal WBC. Remains with no further episodes of pain in the ED. Will refer to PCP for follow up.  Discussed my standard return precautions.        As part of my medical decision making, I reviewed the following data within the electronic MEDICAL RECORD NUMBER Nursing notes reviewed and incorporated, Labs reviewed , EKG interpreted , Old chart reviewed, Radiograph reviewed  Notes from prior ED visits and [place] Controlled Substance Database   Patient was evaluated in Emergency Department today for the symptoms described in the history of present illness. Patient was evaluated in the context of the global COVID-19 pandemic, which necessitated consideration that the patient might be at risk for infection with the SARS-CoV-2 virus that causes COVID-19. Institutional protocols and algorithms that pertain to the evaluation of patients at risk for COVID-19 are in a state of rapid change based on information released by regulatory bodies including the CDC and federal and state organizations. These policies and algorithms were followed during the patient's care in the ED.   ____________________________________________   FINAL CLINICAL IMPRESSION(S) / ED DIAGNOSES   Final diagnoses:  Atypical chest pain      NEW MEDICATIONS STARTED DURING THIS VISIT:  ED Discharge Orders    None       Note:  This document was prepared using Dragon voice recognition software and may include unintentional dictation errors.    Don PerkingVeronese, [place], MD 07/16/19 [phone]

## 2019-12-08 IMAGING — US US RENAL
1 series · 14 of 25 positions shown · non-contrast
Comparison: 07/25/2018

CLINICAL DATA: Right-sided flank pain, history of distal right
ureteral stone

EXAM:
RENAL / URINARY TRACT ULTRASOUND COMPLETE

[Series 1: us renal · 0.30mm/px · 14 of 55 slices shown]
[im 1/55]
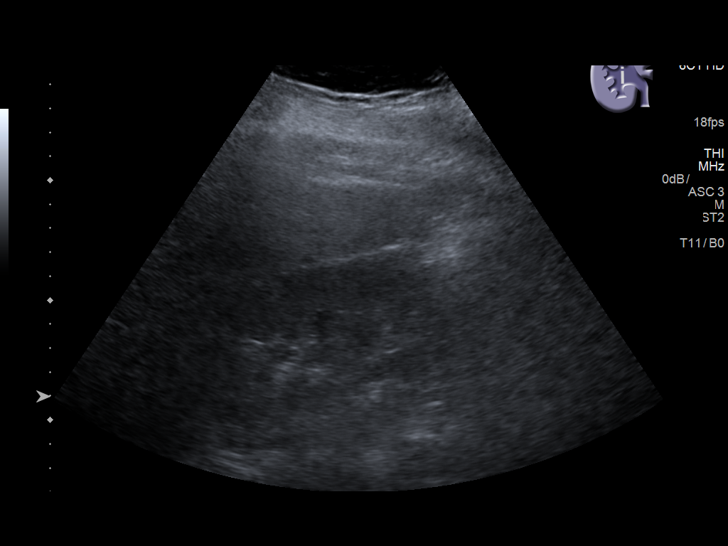
[im 5/55]
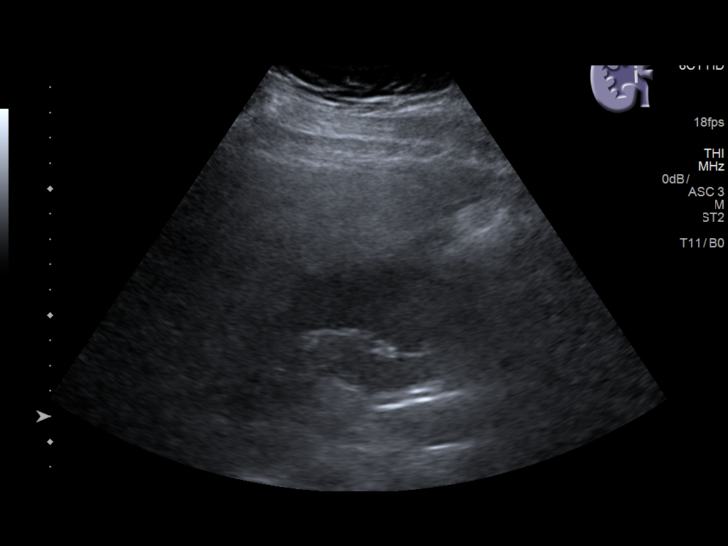
[im 10/55]
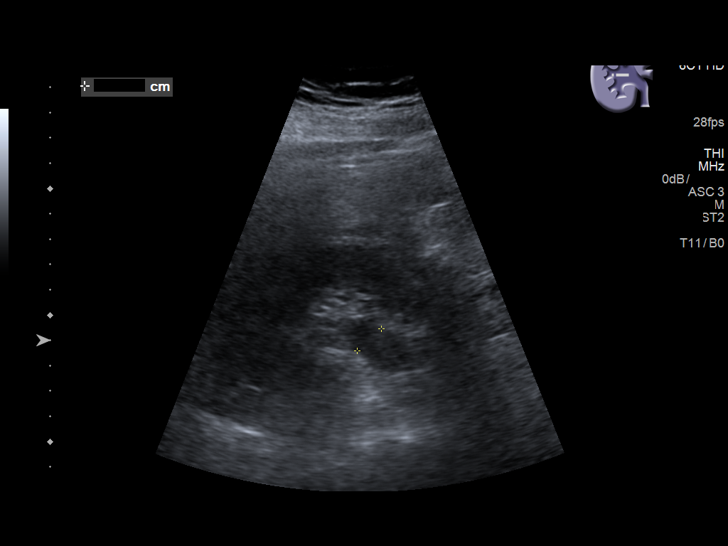
[im 14/55]
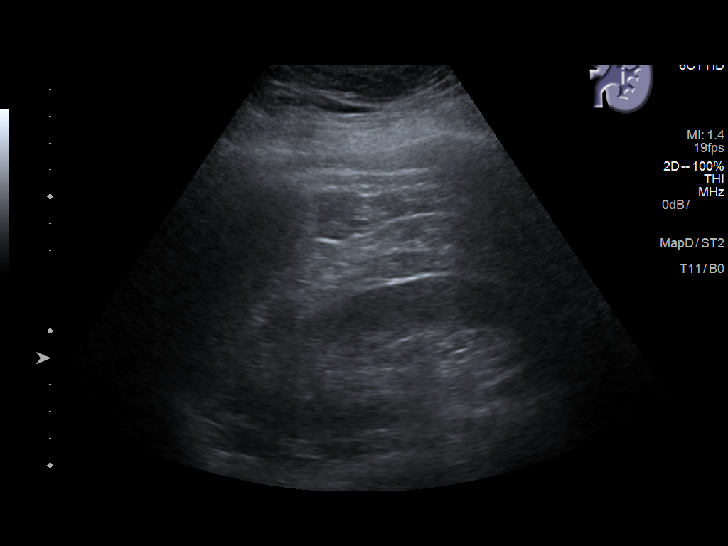
[im 19/55]
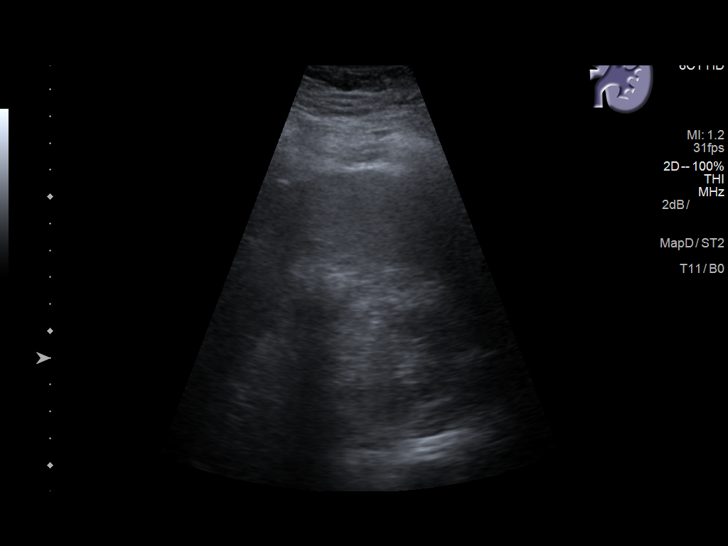
[im 21/55]
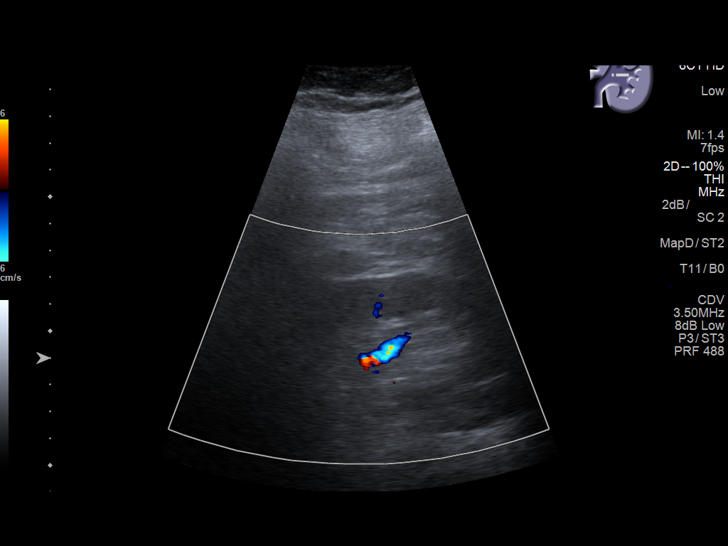
[im 25/55]
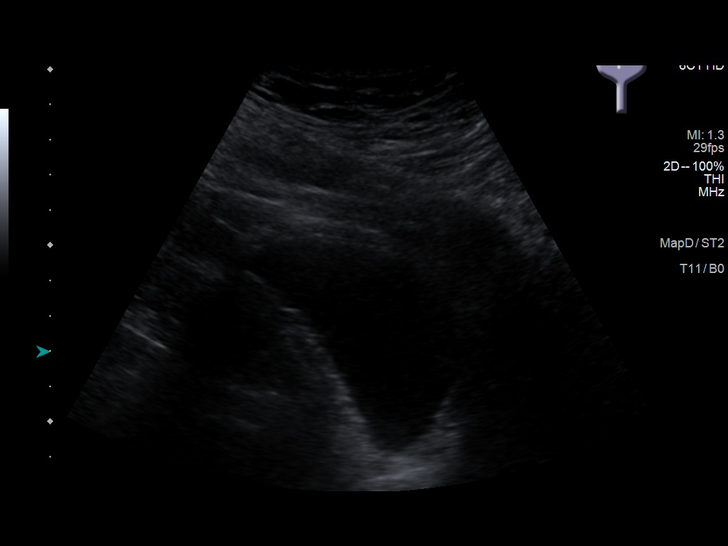
[im 30/55]
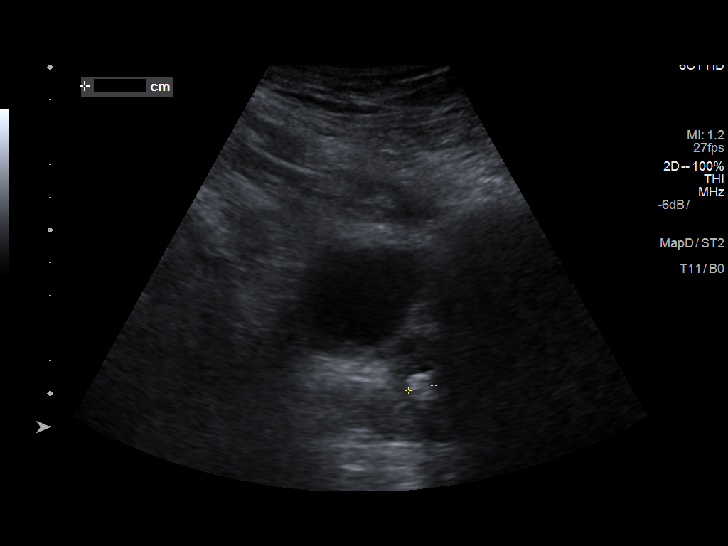
[im 34/55]
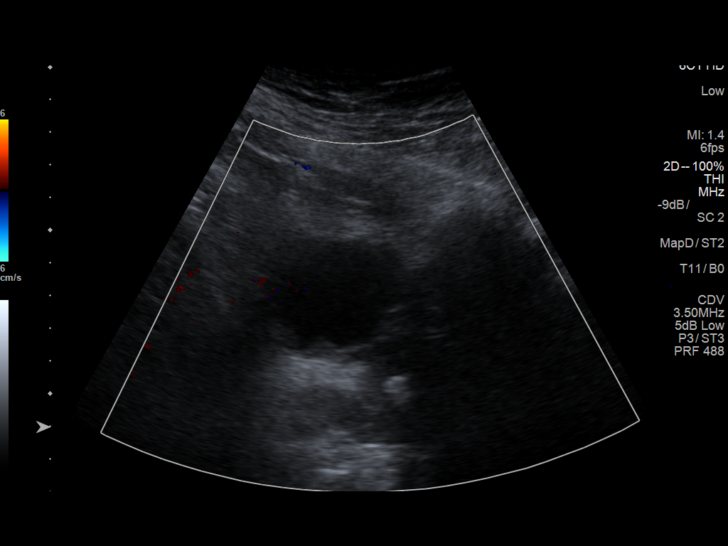
[im 37/55]
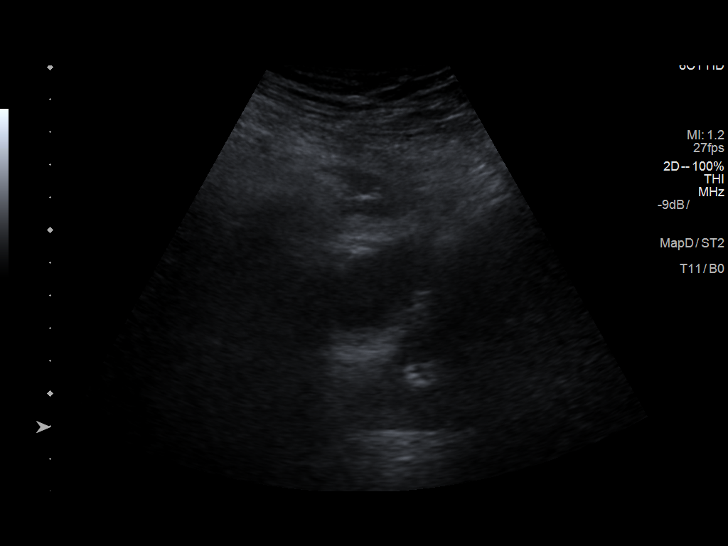
[im 41/55]
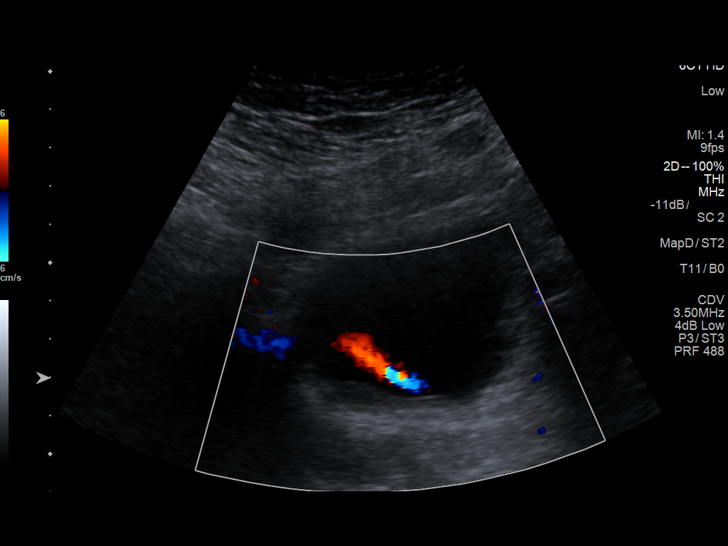
[im 46/55]
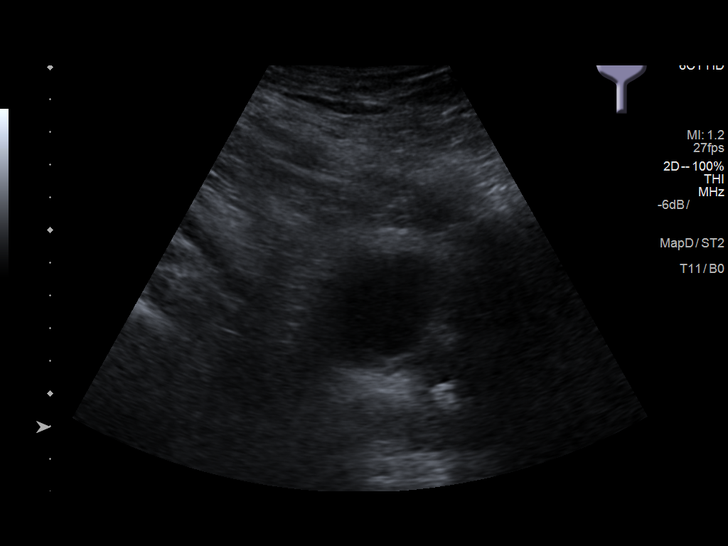
[im 50/55]
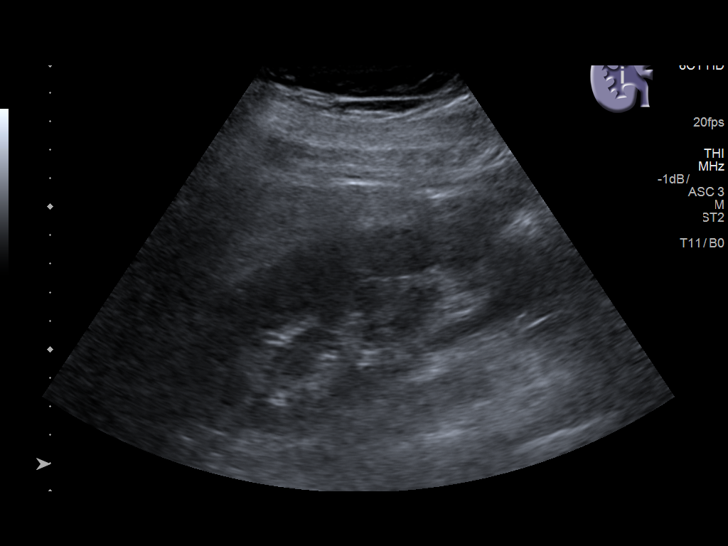
[im 55/55]
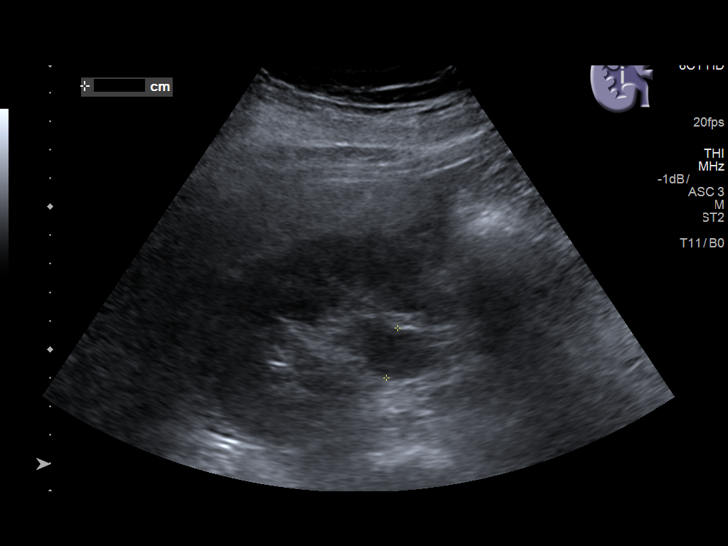

[14 of 25 positions shown; findings below may reference images not displayed]

FINDINGS: Right Kidney:

Length: 12.6 cm. Moderate hydronephrosis is noted increased from the
prior CT examination. This extends to the level of the UVJ. An 8 mm
echogenic focus with posterior acoustical shadowing is noted in the
distal aspect of the right ureter similar to the previously seen
stone but slightly distally migrated.

Left Kidney:

Length: 12.0 cm. Echogenicity within normal limits. No mass or
hydronephrosis visualized.

Bladder:

Appears normal for degree of bladder distention.
IMPRESSION: Increasing right-sided hydronephrosis as well as distal right
ureteral stone slightly more distally located when compared with the
prior exam.

## 2023-05-11 ENCOUNTER — Ambulatory Visit (INDEPENDENT_AMBULATORY_CARE_PROVIDER_SITE_OTHER): Payer: BC Managed Care – PPO | Admitting: Podiatry

## 2023-05-11 DIAGNOSIS — L600 Ingrowing nail: Secondary | ICD-10-CM

## 2023-05-11 NOTE — Progress Notes (Signed)
   Chief Complaint  Patient presents with   Nail Problem    NP Dead toenail on big toe (left foot) that is painful and maybe growing into toe itself    HPI: 37 y.o. male presenting today as a new patient for evaluation of thick fully dystrophic toenail to the left hallux nail plate.  Onset a few years ago.  Patient does recall history of injuring the toenail.  He says it is very thick and tender and he would like to have it permanently resolved and addressed today.  Past Medical History:  Diagnosis Date   Kidney stone     No past surgical history on file.  No Known Allergies   Physical Exam: General: The patient is alert and oriented x3 in no acute distress.  Dermatology: Complete nail dystrophy noted to the left hallux nail plate.  The nail plate appears very unhealthy and dystrophic all the way to the nail base.  Associated tenderness with palpation  Vascular: Palpable pedal pulses bilaterally. Capillary refill within normal limits.  No appreciable edema.  No erythema.  Neurological: Grossly intact via light touch  Musculoskeletal Exam: No pedal deformities noted  Assessment/Plan of Care: 1.  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  Recommend total permanent nail avulsion given the extent of the nail dystrophy.  Patient agrees. -The toe was prepped in aseptic manner and digital block performed using 3 mL of 2% lidocaine plain.  The nail was avulsed in its entirety followed by 3 x 30-second application of phenol and alcohol flush.  Dressings applied.  Post care instructions provided -Return to clinic 3 weeks  *Long-haul truck driver    Felecia Shelling, DPM [facility]  Dr. Felecia Shelling, DPM    [address] [place], [place] [address]                Office [phone]  Fax [phone]

## 2023-06-15 ENCOUNTER — Ambulatory Visit: Payer: BC Managed Care – PPO | Admitting: Podiatry

## 2023-08-19 ENCOUNTER — Encounter: Payer: Self-pay | Admitting: Emergency Medicine

## 2023-08-19 ENCOUNTER — Emergency Department
Admission: EM | Admit: 2023-08-19 | Discharge: 2023-08-19 | Disposition: A | Discharge status: Home / Self Care | Payer: BC Managed Care – PPO | Attending: Emergency Medicine | Admitting: Emergency Medicine

## 2023-08-19 ENCOUNTER — Other Ambulatory Visit: Payer: Self-pay

## 2023-08-19 DIAGNOSIS — R350 Frequency of micturition: Secondary | ICD-10-CM | POA: Diagnosis present

## 2023-08-19 DIAGNOSIS — N390 Urinary tract infection, site not specified: Secondary | ICD-10-CM | POA: Diagnosis not present

## 2023-08-19 LAB — URINALYSIS, ROUTINE W REFLEX MICROSCOPIC
Bilirubin Urine: NEGATIVE
Glucose, UA: NEGATIVE mg/dL
Ketones, ur: NEGATIVE mg/dL
Nitrite: POSITIVE — AB
Protein, ur: 100 mg/dL — AB
RBC / HPF: >50 RBC/hpf (ref 0–5)
Specific Gravity, Urine: 1.014 (ref 1.005–1.030)
Squamous Epithelial / HPF: 0 /HPF (ref 0–5)
WBC, UA: >50 WBC/hpf (ref 0–5)
pH: 6 (ref 5.0–8.0)

## 2023-08-19 LAB — CBC
HCT: 44.5 % (ref 39.0–52.0)
Hemoglobin: 15.3 g/dL (ref 13.0–17.0)
MCH: 28.6 pg (ref 26.0–34.0)
MCHC: 34.4 g/dL (ref 30.0–36.0)
MCV: 83.2 fL (ref 80.0–100.0)
Platelets: 195 10*3/uL (ref 150–400)
RBC: 5.35 MIL/uL (ref 4.22–5.81)
RDW: 13.2 % (ref 11.5–15.5)
WBC: 14.4 10*3/uL — ABNORMAL HIGH (ref 4.0–10.5)
nRBC: 0 % (ref 0.0–0.2)

## 2023-08-19 LAB — BASIC METABOLIC PANEL
Anion gap: 11 (ref 5–15)
BUN: 11 mg/dL (ref 6–20)
CO2: 25 mmol/L (ref 22–32)
Calcium: 8.8 mg/dL — ABNORMAL LOW (ref 8.9–10.3)
Chloride: 100 mmol/L (ref 98–111)
Creatinine, Ser: 0.92 mg/dL (ref 0.61–1.24)
GFR, Estimated: >60 mL/min (ref >60)
Glucose, Bld: 141 mg/dL — ABNORMAL HIGH (ref 70–99)
Potassium: 4.2 mmol/L (ref 3.5–5.1)
Sodium: 136 mmol/L (ref 135–145)

## 2023-08-19 LAB — LACTIC ACID, PLASMA: Lactic Acid, Venous: 1.9 mmol/L (ref 0.5–1.9)

## 2023-08-19 MED ORDER — ACETAMINOPHEN 500 MG PO TABS
1000.0000 mg | ORAL_TABLET | Freq: Once | ORAL | Status: AC
  Administered 2023-08-19: 1000 mg via ORAL
  Filled 2023-08-19: qty 2

## 2023-08-19 MED ORDER — CEFUROXIME AXETIL 500 MG PO TABS: 500.0000 mg | ORAL_TABLET | Freq: Two times a day (BID) | ORAL | 0 refills | Status: AC

## 2023-08-19 MED ORDER — SODIUM CHLORIDE 0.9 % IV BOLUS
1000.0000 mL | Freq: Once | INTRAVENOUS | Status: AC
  Administered 2023-08-19: 1000 mL via INTRAVENOUS

## 2023-08-19 MED ORDER — SODIUM CHLORIDE 0.9 % IV SOLN
2.0000 g | Freq: Once | INTRAVENOUS | Status: AC
  Administered 2023-08-19: 2 g via INTRAVENOUS
  Filled 2023-08-19: qty 20

## 2023-08-19 MED ORDER — ONDANSETRON 4 MG PO TBDP: 4.0000 mg | ORAL_TABLET | Freq: Three times a day (TID) | ORAL | 0 refills | Status: AC | PRN

## 2023-08-19 NOTE — Discharge Instructions (Addendum)
You were seen in the emergency department and diagnosed with a complex urinary tract infection.  You were given IV antibiotics in the emergency department.  You were started on an antibiotic, it is importantly take this as prescribed.  Call your primary care physician to schedule close follow-up to be seen in the next 1 to 2 days.  You are given information to follow-up with urology.  It is importantly return to the emergency department if you have ongoing symptoms after 2 days of antibiotics at home or if you develop vomiting, worsening fever or back pain.  Thank you for choosing [place] for your health care, it was my pleasure to care for you today!  Corena Herter, MD

## 2023-08-19 NOTE — ED Triage Notes (Signed)
Pt via POV from home. Pt c/o bladder pain, urinary frequency, and dark urine since Thursday. Pt went to a clinic today and urine dipstick was done. Results are in the chart. Pt also reports fever but denies taking any Tylenol today. Pt is A&Ox4 and NAD

## 2023-08-19 NOTE — ED Provider Notes (Signed)
[facility] Provider Note    Event Date/Time   First MD Initiated Contact with Patient 08/19/23 1850     (approximate)   History   Urinary Frequency   HPI  Darrell Barnes is a 37 y.o. male no significant past medical history presents to the emergency department with fever and burning with urination.  States that he started having burning with urination today.  Also started to have a fever today.  Denies any nausea, vomiting, back pain or lower abdominal pain at this time.  Denies any history of pyelonephritis or kidney infection in the past.  Does have a history of kidney stones but states that he is not have any pain at this time and this does not feel similar to kidney stones.  Denies any pains with bowel movement.     Physical Exam   Triage Vital Signs: ED Triage Vitals  Encounter Vitals Group     BP 08/19/23 1717 (!) 155/83     Systolic BP Percentile --      Diastolic BP Percentile --      Pulse Rate 08/19/23 1717 84     Resp 08/19/23 1717 18     Temp 08/19/23 1717 (!) 101.1 F (38.4 C)     Temp src --      SpO2 08/19/23 1717 98 %     Weight 08/19/23 1717 299 lb (135.6 kg)     Height 08/19/23 1717 5\' 5"  (1.651 m)     Head Circumference --      Peak Flow --      Pain Score 08/19/23 1716 4     Pain Loc --      Pain Education --      Exclude from Growth Chart --     Most recent vital signs: Vitals:   08/19/23 2045 08/19/23 2049  BP:  111/67  Pulse: 80   Resp:    Temp:    SpO2: 96%     Physical Exam Exam conducted with a chaperone present.  Constitutional:      Appearance: He is well-developed.  HENT:     Head: Atraumatic.  Eyes:     Conjunctiva/sclera: Conjunctivae normal.  Cardiovascular:     Rate and Rhythm: Regular rhythm.  Pulmonary:     Effort: No respiratory distress.  Genitourinary:    Prostate: Normal. Not tender.     Rectum: Normal.  Musculoskeletal:     Cervical back: Normal range of motion.  Skin:    General:  Skin is warm.  Neurological:     Mental Status: He is alert. Mental status is at baseline.     IMPRESSION / MDM / ASSESSMENT AND PLAN / ED COURSE  I reviewed the triage vital signs and the nursing notes.  Differential diagnosis including pyelonephritis, complicated urinary tract infection, prostatitis, intra-abdominal infection  On arrival febrile but otherwise hemodynamically stable  RADIOLOGY LABS (all labs ordered are listed, but only abnormal results are displayed) Labs interpreted as -    Labs Reviewed  URINALYSIS, ROUTINE W REFLEX MICROSCOPIC - Abnormal; Notable for the following components:      Result Value   Color, Urine YELLOW (*)    APPearance CLOUDY (*)    Hgb urine dipstick LARGE (*)    Protein, ur 100 (*)    Nitrite POSITIVE (*)    Leukocytes,Ua LARGE (*)    Bacteria, UA MANY (*)    All other components within normal limits  BASIC METABOLIC PANEL -  Abnormal; Notable for the following components:   Glucose, Bld 141 (*)    Calcium 8.8 (*)    All other components within normal limits  CBC - Abnormal; Notable for the following components:   WBC 14.4 (*)    All other components within normal limits  URINE CULTURE  LACTIC ACID, PLASMA     MDM  Patient given antipyretics, IV fluids and IV Rocephin to cover for urinary tract infection.  Patient does have leukocytosis.  Creatinine at baseline.  Does have findings concerning for urinary tract infection.  Lactic acid within normal limits.  Normal rectal exam with no tenderness to the prostate, have a low suspicion for prostatitis  On reevaluation vital signs significantly improved.  Afebrile.  Tolerating p.o.  No signs of distress.  Repeat exam with no abdominal pain and no CVA tenderness.  Shared decision making with the patient but ultimately wanted to discharge home.  Will start the patient on antibiotics.  Given information to follow-up with urology given his complicated urinary tract infection.  Discussed that he  needs to be seen by his primary care physician within the next 1 to 2 days for reevaluation.  Discussed that he needs to return to the emergency department for ongoing symptoms or worsening symptoms.  No questions at time of discharge.     PROCEDURES:  Critical Care performed: no  Procedures  Patient's presentation is most consistent with acute presentation with potential threat to life or bodily function.   MEDICATIONS ORDERED IN ED: Medications  acetaminophen (TYLENOL) tablet 1,000 mg (1,000 mg Oral Given 08/19/23 1732)  sodium chloride 0.9 % bolus 1,000 mL (0 mLs Intravenous Stopped 08/19/23 2049)  cefTRIAXone (ROCEPHIN) 2 g in sodium chloride 0.9 % 100 mL IVPB (0 g Intravenous Stopped 08/19/23 2032)    FINAL CLINICAL IMPRESSION(S) / ED DIAGNOSES   Final diagnoses:  Complicated UTI (urinary tract infection)     Rx / DC Orders   ED Discharge Orders          Ordered    cefUROXime (CEFTIN) 500 MG tablet  2 times daily with meals        08/19/23 2042    ondansetron (ZOFRAN-ODT) 4 MG disintegrating tablet  Every 8 hours PRN        08/19/23 2042             Note:  This document was prepared using Dragon voice recognition software and may include unintentional dictation errors.   Corena Herter, MD 08/19/23 2248

## 2023-08-21 LAB — URINE CULTURE: Culture: >=100000 — AB
